# Patient Record
Sex: Male | Born: 1960 | Race: White | Hispanic: No | Marital: Married | Smoking: Never smoker
Health system: Southern US, Community
[De-identification: ages and names within clinical notes are randomized; demographics above are authoritative.]

## PROBLEM LIST (undated history)

## (undated) DIAGNOSIS — E785 Hyperlipidemia, unspecified: Secondary | ICD-10-CM

## (undated) DIAGNOSIS — J45909 Unspecified asthma, uncomplicated: Secondary | ICD-10-CM

## (undated) DIAGNOSIS — I4430 Unspecified atrioventricular block: Secondary | ICD-10-CM

## (undated) DIAGNOSIS — G4730 Sleep apnea, unspecified: Secondary | ICD-10-CM

## (undated) DIAGNOSIS — T7840XA Allergy, unspecified, initial encounter: Secondary | ICD-10-CM

## (undated) DIAGNOSIS — Z9109 Other allergy status, other than to drugs and biological substances: Secondary | ICD-10-CM

## (undated) DIAGNOSIS — K625 Hemorrhage of anus and rectum: Secondary | ICD-10-CM

## (undated) HISTORY — PX: COLONOSCOPY: SHX174

## (undated) HISTORY — DX: Hemorrhage of anus and rectum: K62.5

## (undated) HISTORY — DX: Hyperlipidemia, unspecified: E78.5

## (undated) HISTORY — DX: Allergy, unspecified, initial encounter: T78.40XA

## (undated) HISTORY — DX: Unspecified atrioventricular block: I44.30

## (undated) HISTORY — DX: Unspecified asthma, uncomplicated: J45.909

## (undated) HISTORY — PX: WISDOM TOOTH EXTRACTION: SHX21

## (undated) HISTORY — PX: INGUINAL HERNIA REPAIR: SUR1180

## (undated) HISTORY — PX: POLYPECTOMY: SHX149

## (undated) HISTORY — DX: Other allergy status, other than to drugs and biological substances: Z91.09

## (undated) HISTORY — DX: Sleep apnea, unspecified: G47.30

---

## 2003-07-08 ENCOUNTER — Encounter: Payer: Self-pay | Admitting: Gastroenterology

## 2008-07-05 ENCOUNTER — Encounter: Payer: Self-pay | Admitting: Gastroenterology

## 2008-08-09 ENCOUNTER — Encounter: Payer: Self-pay | Admitting: Gastroenterology

## 2008-08-29 ENCOUNTER — Encounter: Payer: Self-pay | Admitting: Gastroenterology

## 2008-08-29 DIAGNOSIS — K625 Hemorrhage of anus and rectum: Secondary | ICD-10-CM | POA: Insufficient documentation

## 2008-08-29 DIAGNOSIS — E785 Hyperlipidemia, unspecified: Secondary | ICD-10-CM | POA: Insufficient documentation

## 2008-08-29 DIAGNOSIS — D126 Benign neoplasm of colon, unspecified: Secondary | ICD-10-CM | POA: Insufficient documentation

## 2008-09-06 ENCOUNTER — Ambulatory Visit: Payer: Self-pay | Admitting: Gastroenterology

## 2008-10-11 ENCOUNTER — Telehealth: Payer: Self-pay | Admitting: Gastroenterology

## 2008-10-11 ENCOUNTER — Ambulatory Visit: Payer: Self-pay | Admitting: Gastroenterology

## 2008-10-12 ENCOUNTER — Encounter: Payer: Self-pay | Admitting: Gastroenterology

## 2008-10-12 DIAGNOSIS — K649 Unspecified hemorrhoids: Secondary | ICD-10-CM | POA: Insufficient documentation

## 2008-11-09 ENCOUNTER — Ambulatory Visit (HOSPITAL_COMMUNITY): Admission: RE | Admit: 2008-11-09 | Discharge: 2008-11-09 | Payer: Self-pay | Admitting: Gastroenterology

## 2008-11-09 ENCOUNTER — Encounter: Payer: Self-pay | Admitting: Gastroenterology

## 2009-01-13 ENCOUNTER — Ambulatory Visit: Payer: Self-pay | Admitting: Gastroenterology

## 2010-06-03 HISTORY — PX: HERNIA REPAIR: SHX51

## 2010-06-19 ENCOUNTER — Ambulatory Visit (HOSPITAL_COMMUNITY)
Admission: RE | Admit: 2010-06-19 | Discharge: 2010-06-19 | Payer: Self-pay | Source: Home / Self Care | Attending: General Surgery | Admitting: General Surgery

## 2010-06-20 LAB — DIFFERENTIAL
Basophils Absolute: 0 10*3/uL (ref 0.0–0.1)
Basophils Relative: 0 % (ref 0–1)
Eosinophils Absolute: 0.1 10*3/uL (ref 0.0–0.7)
Eosinophils Relative: 2 % (ref 0–5)
Lymphocytes Relative: 33 % (ref 12–46)
Lymphs Abs: 2.7 10*3/uL (ref 0.7–4.0)
Monocytes Absolute: 0.7 10*3/uL (ref 0.1–1.0)
Monocytes Relative: 9 % (ref 3–12)
Neutro Abs: 4.5 10*3/uL (ref 1.7–7.7)
Neutrophils Relative %: 56 % (ref 43–77)

## 2010-06-20 LAB — BASIC METABOLIC PANEL
BUN: 14 mg/dL (ref 6–23)
CO2: 27 mEq/L (ref 19–32)
Calcium: 9.8 mg/dL (ref 8.4–10.5)
Chloride: 105 mEq/L (ref 96–112)
Creatinine, Ser: 1.13 mg/dL (ref 0.4–1.5)
GFR calc Af Amer: >60 mL/min (ref >60)
GFR calc non Af Amer: >60 mL/min (ref >60)
Glucose, Bld: 134 mg/dL — ABNORMAL HIGH (ref 70–99)
Potassium: 4.3 mEq/L (ref 3.5–5.1)
Sodium: 140 mEq/L (ref 135–145)

## 2010-06-20 LAB — SURGICAL PCR SCREEN
MRSA, PCR: NEGATIVE
Staphylococcus aureus: NEGATIVE

## 2010-06-20 LAB — CBC
HCT: 47.7 % (ref 39.0–52.0)
Hemoglobin: 15.8 g/dL (ref 13.0–17.0)
MCH: 28.1 pg (ref 26.0–34.0)
MCHC: 33.1 g/dL (ref 30.0–36.0)
MCV: 84.9 fL (ref 78.0–100.0)
Platelets: 207 10*3/uL (ref 150–400)
RBC: 5.62 MIL/uL (ref 4.22–5.81)
RDW: 13.3 % (ref 11.5–15.5)
WBC: 8 10*3/uL (ref 4.0–10.5)

## 2010-07-06 NOTE — Op Note (Signed)
  NAME:  Paul Rich, Paul Rich             ACCOUNT NO.:  [id number]  MEDICAL RECORD NO.:  [id number]          PATIENT TYPE:  AMB  LOCATION:  SDS                          FACILITY:  [facility]  PHYSICIAN:  Ollen Gross. Vernell Morgans, M.D. DATE OF BIRTH:  Jun 05, 1960  DATE OF PROCEDURE:  06/19/2010 DATE OF DISCHARGE:                              OPERATIVE REPORT   PREOPERATIVE DIAGNOSIS:  Umbilical hernia.  POSTOPERATIVE DIAGNOSIS:  Umbilical hernia.  PROCEDURE:  Umbilical hernia repair with mesh.  SURGEON:  Ollen Gross. Vernell Morgans, M.D.  ANESTHESIA:  General endotracheal.  PROCEDURE IN DETAIL:  After informed consent was obtained, the patient was brought to the operating room, placed in supine position on the operating room table.  After adequate induction of general anesthesia, the patient's abdomen was prepped with ChloraPrep, allowed to dry, and draped in usual sterile manner.  The area around the umbilicus was infiltrated with 0.25% Marcaine.  A small infraumbilical curvilinear incision was made with a 15 blade knife.  This incision was carried down through the skin and subcutaneous tissue sharply with the electrocautery.  The hernia defect was then opened with the electrocautery.  There was just some preperitoneal fat within the hernia.  This was reduced.  The underside of the fascia was cleared by combination of some sharp Bovie dissection and some blunt finger dissection.  We did not enter the abdominal cavity.  We then chose a small piece of the circular umbilical hernia repair with mesh.  This was placed in the preperitoneal space below the fascia.  The fascia was then closed over this with interrupted #1 Novafil stitches.  The wound was irrigated with copious amounts of saline.  The subcutaneous tissue was closed with interrupted 3-0 Vicryl stitches.  Skin was closed with running 4-0 Monocryl subcuticular stitch.  Dermabond dressing was applied.  The patient tolerated the procedure well.  At the  end of the case all needle, sponge, and instrument counts were correct.  The patient was then awakened and taken to recovery in stable condition.     Ollen Gross. Vernell Morgans, M.D.     PST/MEDQ  D:  06/19/2010  T:  06/19/2010  Job:  [id number]  Electronically Signed by Chevis Pretty III M.D. on 07/06/2010 08:53:00 AM

## 2011-03-21 ENCOUNTER — Institutional Professional Consult (permissible substitution): Payer: Self-pay | Admitting: Internal Medicine

## 2011-04-19 ENCOUNTER — Institutional Professional Consult (permissible substitution): Payer: Self-pay | Admitting: Internal Medicine

## 2011-05-21 ENCOUNTER — Ambulatory Visit (INDEPENDENT_AMBULATORY_CARE_PROVIDER_SITE_OTHER): Payer: 59 | Admitting: Internal Medicine

## 2011-05-21 ENCOUNTER — Encounter: Payer: Self-pay | Admitting: Internal Medicine

## 2011-05-21 VITALS — BP 124/70 | HR 64 | Ht 74.0 in | Wt 226.8 lb

## 2011-05-21 DIAGNOSIS — G4733 Obstructive sleep apnea (adult) (pediatric): Secondary | ICD-10-CM | POA: Insufficient documentation

## 2011-05-21 DIAGNOSIS — T7840XA Allergy, unspecified, initial encounter: Secondary | ICD-10-CM

## 2011-05-21 DIAGNOSIS — Z91013 Allergy to seafood: Secondary | ICD-10-CM

## 2011-05-21 DIAGNOSIS — J3089 Other allergic rhinitis: Secondary | ICD-10-CM

## 2011-05-21 NOTE — Progress Notes (Signed)
05/21/11- 71 yoM never smoker referred courtesy of Dr Meredith Staggers with concern of sleep apnea. A previous sleep study 05/13/10 at [facility] gave an AHI of 5.7 hr. Wife Biomedical scientist) is concerned that he snores loudly and stops breathing., He admits snoring wakes himself. He exercises regularly and feels well, but admits with travel he may not sleep as well.  Bed time 03-1029 PM, latency 20-30 minutes, waking twice before finally up at 6AM. He was titrated for CPAP after his original study, but hasn't used it.  No ENT surgery. 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   ROS-see HPI Constitutional:   No-   weight loss, night sweats, fevers, chills, fatigue, lassitude. HEENT:   No-  headaches, difficulty swallowing, tooth/dental problems, sore throat,       Occasional  sneezing, itching, ear ache, nasal congestion, post nasal drip,  CV:  No-   chest pain, orthopnea, PND, swelling in lower extremities, anasarca,                                  dizziness, palpitations Resp: No-   shortness of breath with exertion or at rest.              No-   productive cough,  No non-productive cough,  No- coughing up of blood.              No-   change in color of mucus.  No- wheezing.   Skin: No-   rash or lesions. GI:  No-   heartburn, indigestion, abdominal pain, nausea, vomiting, diarrhea,  GU:  MS:  No-   joint pain or swelling.  No- decreased range of motion.  No- back pain. Neuro-     nothing unusual Psych:  No- change in mood or affect. No depression or anxiety.  No memory loss.  OBJ General- Alert, Oriented, Affect-appropriate, Distress- none acute, muscular Skin- rash-none, lesions- none, excoriation- none Lymphadenopathy- none Head- atraumatic            Eyes- Gross vision intact, PERRLA, conjunctivae clear secretions            Ears- Hearing, canals-normal            Nose- Nasal speech, , +Septal dev to R, mucus, polyps,  erosion, perforation             Throat- Mallampati II-III , mucosa clear , drainage- none, tonsils- moderate Neck- flexible , trachea midline, trace inspiratory stridor , thyroid nl, carotid no bruit Chest - symmetrical excursion , unlabored           Heart/CV- RRR , no murmur , no gallop  , no rub, nl s1 s2                           - JVD- none , edema- none, stasis changes- none, varices- none           Lung- clear to P&A, wheeze- none, cough- none , dullness-none, rub- none           Chest wall-  Abd- tender-no, distended-no, bowel sounds-present, HSM- no Br/ Gen/ Rectal- Not done, not indicated Extrem- cyanosis- none, clubbing, none, atrophy- none, strength- nl Neuro- grossly intact to observation

## 2011-05-21 NOTE — Patient Instructions (Signed)
Order- schedule split protocol NPSG    Dx OSA 

## 2011-05-24 DIAGNOSIS — Z91013 Allergy to seafood: Secondary | ICD-10-CM | POA: Insufficient documentation

## 2011-05-24 DIAGNOSIS — J3089 Other allergic rhinitis: Secondary | ICD-10-CM | POA: Insufficient documentation

## 2011-05-24 NOTE — Assessment & Plan Note (Signed)
Tpo manage medically. Watch for persistence of nasal stuffiness noted today, as it may contribute to snoring.

## 2011-05-24 NOTE — Assessment & Plan Note (Signed)
Discussed pretreatment with an antihistamine before eating unknown foods, but emphasis on avoiding known triggers.

## 2011-05-24 NOTE — Assessment & Plan Note (Signed)
His wife is a reliable witness and concerned about what she notices . We discussed this and will repeat the sleep study. He and I discussed risk factors and medical concerns related to sleep apnea. We compared options of home/unattended study vs center based attended study and he chose the latter.

## 2011-06-09 ENCOUNTER — Ambulatory Visit (HOSPITAL_BASED_OUTPATIENT_CLINIC_OR_DEPARTMENT_OTHER): Payer: 59 | Attending: Internal Medicine

## 2011-06-09 VITALS — Ht 74.0 in | Wt 220.0 lb

## 2011-06-09 DIAGNOSIS — G4733 Obstructive sleep apnea (adult) (pediatric): Secondary | ICD-10-CM | POA: Insufficient documentation

## 2011-06-09 DIAGNOSIS — Z9989 Dependence on other enabling machines and devices: Secondary | ICD-10-CM

## 2011-06-15 DIAGNOSIS — G4733 Obstructive sleep apnea (adult) (pediatric): Secondary | ICD-10-CM

## 2011-06-16 NOTE — Procedures (Signed)
NAME:  Paul Rich, STENCIL NO.:  [id number]  MEDICAL RECORD NO.:  [id number]          PATIENT TYPE:  OUT  LOCATION:  SLEEP CENTER                 FACILITY:  [facility]  PHYSICIAN:  Aireal Slater D. Maple Hudson, MD, FCCP, FACPDATE OF BIRTH:  02/02/1962  DATE OF STUDY:  06/09/2011                           NOCTURNAL POLYSOMNOGRAM  REFERRING PHYSICIAN:  Vidal Lampkins D. Maple Hudson, MD, FCCP, FACP  REFERRING DOCTOR:  Savina Olshefski D. Jareb Radoncic, MD, FCCP, FACP  INDICATION FOR STUDY:  Hypersomnia with sleep apnea.  EPWORTH SLEEPINESS SCORE:  8/24.  BMI 28.2, weight 220 pounds, height 74 inches, neck 17.7 inches.  MEDICATIONS:  Home medications are charted and reviewed.  SLEEP ARCHITECTURE:  Split study protocol.  During the diagnostic phase, total sleep time 126 minutes with sleep efficiency 92.3%.  Stage I was 9.1%, stage II was 64.7%, stage III was absent, REM 26.2% of total sleep time.  Sleep latency 7 minutes, REM latency 81 minutes, awake after sleep onset 3.5 minutes, arousal index 13.8.  Bedtime medication: None.  RESPIRATORY DATA:  Split study protocol.  Apnea-hypopnea index (AHI) 22.4 per hour.  A total of 47 events was scored including 3 obstructive apneas and 44 hypopneas.  Most events were associated with supine sleep position.  REM AHI 3.6 per hour.  CPAP was titrated to 6 CWP, AHI 0.4 per hour.  He wore a medium ResMed Quattro FX full-face mask with C-Flex setting of 1.  OXYGEN DATA:  Moderate snoring before CPAP with oxygen desaturation to a nadir of 86% on room air.  With CPAP titration, mean oxygen saturation held 94.7% on room air and snoring was prevented.  CARDIAC DATA:  Sinus rhythm.  MOVEMENT-PARASOMNIA:  No significant movement disturbance.  No bathroom trips.  IMPRESSIONS-RECOMMENDATIONS: 1. Moderate obstructive sleep apnea/hypopnea syndrome, AHI 22.4 per     hour with mainly supine events, moderate snoring and oxygen     desaturation to a nadir of 86% on room air. 2.  Successful CPAP titration to 6 CWP, AHI 0.4 per hour.  He wore a     medium ResMed Quattro FX full-face mask with heated humidifier and     a C-Flex setting of 1.     Jaaziel Peatross D. Maple Hudson, MD, [facility], FACP Diplomate, Biomedical engineer of Sleep Medicine Electronically Signed    CDY/MEDQ  D:  06/15/2011 10:17:14  T:  06/16/2011 14:16:11  Job:  [id number]

## 2011-06-25 ENCOUNTER — Ambulatory Visit (INDEPENDENT_AMBULATORY_CARE_PROVIDER_SITE_OTHER): Payer: 59 | Admitting: Internal Medicine

## 2011-06-25 ENCOUNTER — Encounter: Payer: Self-pay | Admitting: Internal Medicine

## 2011-06-25 VITALS — BP 110/80 | HR 62 | Ht 74.0 in | Wt 228.8 lb

## 2011-06-25 DIAGNOSIS — G4733 Obstructive sleep apnea (adult) (pediatric): Secondary | ICD-10-CM

## 2011-06-25 DIAGNOSIS — J3089 Other allergic rhinitis: Secondary | ICD-10-CM

## 2011-06-25 NOTE — Progress Notes (Signed)
05/21/11- 26 yoM never smoker referred courtesy of Dr Meredith Staggers with concern of sleep apnea. A previous sleep study 05/13/10 at [facility] gave an AHI of 5.7 hr. Wife Biomedical scientist) is concerned that he snores loudly and stops breathing., He admits snoring wakes himself. He exercises regularly and feels well, but admits with travel he may not sleep as well.  Bed time 03-1029 PM, latency 20-30 minutes, waking twice before finally up at 6AM. He was titrated for CPAP after his original study, but hasn't used it.  No ENT surgery. 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   06/25/11- 50 yoM never smoker followed for OSA He returned after sleep study and we went over the graphic results, discussed implications and treatment options for his moderate obstructive sleep apnea. NPSG- 06/09/11- AHI 22.4/hr    ROS-see HPI Constitutional:   No-   weight loss, night sweats, fevers, chills, fatigue, lassitude. Exercising regularly HEENT:   No-  headaches, difficulty swallowing, tooth/dental problems, sore throat,       Occasional  sneezing, itching, ear ache, nasal congestion, post nasal drip,  CV:  No-   chest pain, orthopnea, PND, swelling in lower extremities, anasarca,  dizziness, palpitations Resp: No-   shortness of breath with exertion or at rest.              No-   productive cough,  No non-productive cough,  No- coughing up of blood.              No-   change in color of mucus.  No- wheezing.   Skin: No-   rash or lesions. GI:  No-   heartburn, indigestion, abdominal pain, nausea, vomiting, diarrhea,  GU:  MS:  No-   joint pain or swelling.  No- decreased range of motion.  No- back pain. Neuro-     nothing unusual Psych:  No- change in mood or affect. No depression or anxiety.  No memory loss.  OBJ General- Alert, Oriented, Affect-appropriate, Distress- none acute, muscular Skin- rash-none, lesions- none, excoriation-  none Lymphadenopathy- none Head- atraumatic            Eyes- Gross vision intact, PERRLA, conjunctivae clear secretions            Ears- Hearing, canals-normal            Nose- Nasal speech, , +Septal dev to R, mucus, polyps, erosion, perforation             Throat- Mallampati II-III , mucosa clear , drainage- none, tonsils- moderate Neck- flexible , trachea midline, trace inspiratory stridor , thyroid nl, carotid no bruit Chest - symmetrical excursion , unlabored           Heart/CV- RRR , no murmur , no gallop  , no rub, nl s1 s2                           - JVD- none , edema- none, stasis changes- none, varices- none           Lung- clear to P&A, wheeze- none, cough- none , dullness-none, rub- none           Chest wall-  Abd- Br/ Gen/ Rectal- Not done, not indicated Extrem- cyanosis- none, clubbing, none, atrophy- none, strength- nl Neuro- grossly intact to observation

## 2011-06-25 NOTE — Patient Instructions (Signed)
Order-  DME- new CPAP 6 cwp, humidifier, mask of choice  Dx   OSA  Please call as needed

## 2011-06-28 NOTE — Assessment & Plan Note (Signed)
He has some septal deviation. This is not at time of year with significant inhaled allergen exposure. We will watch for interaction with his CPAP therapy as the season changes.

## 2011-06-28 NOTE — Assessment & Plan Note (Signed)
We had a discussion of choices and expectations. CPAP had been successfully titrated to 6 CWP/AHI 0.4 per hour. We will start with home CPAP at 6.

## 2011-08-06 ENCOUNTER — Ambulatory Visit (INDEPENDENT_AMBULATORY_CARE_PROVIDER_SITE_OTHER): Payer: 59 | Admitting: Internal Medicine

## 2011-08-06 ENCOUNTER — Encounter: Payer: Self-pay | Admitting: Internal Medicine

## 2011-08-06 VITALS — BP 116/72 | HR 73 | Ht 74.0 in | Wt 230.0 lb

## 2011-08-06 DIAGNOSIS — G4733 Obstructive sleep apnea (adult) (pediatric): Secondary | ICD-10-CM

## 2011-08-06 NOTE — Patient Instructions (Signed)
Continue trying with CPAP for now.  As an alternative- oral mandibular advancement devices would be a good "Plan B" Orthodontists- Dr Primitivo Gauze- [facility]                         Dr Althea Grimmer   [facility]

## 2011-08-06 NOTE — Progress Notes (Signed)
05/21/11- 41 yoM never smoker referred courtesy of Dr Meredith Staggers with concern of sleep apnea. A previous sleep study 05/13/10 at [facility] gave an AHI of 5.7 hr. Wife Biomedical scientist) is concerned that he snores loudly and stops breathing., He admits snoring wakes himself. He exercises regularly and feels well, but admits with travel he may not sleep as well.  Bed time 03-1029 PM, latency 20-30 minutes, waking twice before finally up at 6AM. He was titrated for CPAP after his original study, but hasn't used it.  No ENT surgery. 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   06/25/11- 50 yoM never smoker followed for OSA He returned after sleep study and we went over the graphic results, discussed implications and treatment options for his moderate obstructive sleep apnea. NPSG- 06/09/11- AHI 22.4/hr   08/06/11- 50 yoM never smoker followed for OSA, allergic rhinitis, Seafood anaphyllaxis/ Epipen He is trying to get used to wearing CPAP. Still pulls off every night after 3 or 4 hours because "it bothers me". He isn't able to articulate what that means but it doesn't hurt and the pressure does not seem too high.  ROS-see HPI Constitutional:   No-   weight loss, night sweats, fevers, chills, fatigue, lassitude. Exercising regularly HEENT:   No-  headaches, difficulty swallowing, tooth/dental problems, sore throat,       Occasional  sneezing, itching, ear ache, nasal congestion, post nasal drip,  CV:  No-   chest pain, orthopnea, PND, swelling in lower extremities, anasarca,  dizziness, palpitations Resp: No-   shortness of breath with exertion or at rest.              No-   productive cough,  No non-productive cough,  No- coughing up of blood.              No-   change in color of mucus.  No- wheezing.   Skin: No-   rash or lesions. GI:  No-   heartburn, indigestion,  GU:  MS:  No-   joint pain or swelling.  Neuro-     nothing  unusual Psych:  No- change in mood or affect. No depression or anxiety.  No memory loss.  OBJ General- Alert, Oriented, Affect-appropriate, Distress- none acute, muscular Skin- rash-none, lesions- none, excoriation- none Lymphadenopathy- none Head- atraumatic            Eyes- Gross vision intact, PERRLA, conjunctivae clear secretions            Ears- Hearing, canals-normal            Nose- Nasal speech, , +Septal dev to R, mucus, polyps, erosion, perforation             Throat- Mallampati II-III , mucosa clear , drainage- none, tonsils- moderate Neck- flexible , trachea midline, trace inspiratory stridor , thyroid nl, carotid no bruit Chest - symmetrical excursion , unlabored           Heart/CV- RRR , no murmur , no gallop  , no rub, nl s1 s2                           - JVD- none , edema- none, stasis changes- none, varices- none           Lung- clear to P&A, wheeze- none, cough- none , dullness-none, rub- none  Chest wall-  Abd- Br/ Gen/ Rectal- Not done, not indicated Extrem- cyanosis- none, clubbing, none, atrophy- none, strength- nl Neuro- grossly intact to observation

## 2011-08-10 NOTE — Assessment & Plan Note (Signed)
CPAP 6. Discussed comfort, compliance and alternatives. He is interested in oral appliances but will continue trying with CPAP.

## 2011-08-15 ENCOUNTER — Encounter: Payer: Self-pay | Admitting: Internal Medicine

## 2011-10-12 ENCOUNTER — Other Ambulatory Visit: Payer: Self-pay | Admitting: Family Medicine

## 2011-12-09 ENCOUNTER — Ambulatory Visit (INDEPENDENT_AMBULATORY_CARE_PROVIDER_SITE_OTHER): Payer: 59 | Admitting: Internal Medicine

## 2011-12-09 ENCOUNTER — Encounter: Payer: Self-pay | Admitting: Internal Medicine

## 2011-12-09 VITALS — BP 112/68 | HR 65 | Ht 74.0 in | Wt 215.4 lb

## 2011-12-09 DIAGNOSIS — G4733 Obstructive sleep apnea (adult) (pediatric): Secondary | ICD-10-CM

## 2011-12-09 NOTE — Patient Instructions (Addendum)
Order- refer to orthodontist Dr Althea Grimmer   For oral appliance for sleep apnea

## 2011-12-09 NOTE — Progress Notes (Signed)
05/21/11- 60 yoM never smoker referred courtesy of Dr Meredith Staggers with concern of sleep apnea. A previous sleep study 05/13/10 at [facility] gave an AHI of 5.7 hr. Wife Biomedical scientist) is concerned that he snores loudly and stops breathing., He admits snoring wakes himself. He exercises regularly and feels well, but admits with travel he may not sleep as well.  Bed time 03-1029 PM, latency 20-30 minutes, waking twice before finally up at 6AM. He was titrated for CPAP after his original study, but hasn't used it.  No ENT surgery. 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   06/25/11- 50 yoM never smoker followed for OSA He returned after sleep study and we went over the graphic results, discussed implications and treatment options for his moderate obstructive sleep apnea. NPSG- 06/09/11- AHI 22.4/hr   08/06/11- 50 yoM never smoker followed for OSA, allergic rhinitis, Seafood anaphyllaxis/ Epipen He is trying to get used to wearing CPAP. Still pulls off every night after 3 or 4 hours because "it bothers me". He isn't able to articulate what that means but it doesn't hurt and the pressure does not seem too high.  12/09/11-  50 yoM never smoker followed for OSA, allergic rhinitis, Seafood anaphyllaxis/ Epipen  Pt states he is still waking mid night and pulls mask off. Currently only wearing mask 3-4 hrs nightly. Pt states the "seal" is good when first falling asleep but from tossing and turning the seal breaks and wakes him up.  Pt is down 15lbs since March, says that wife told him he is not snoring as much. He has always been an active sleeper, tossing and turning but without parasomnia. This pulls on his mask and hose which he finds disturbing. His physician has been dieting and in addition he has been exercising vigorously to result in a 15 pound weight loss. We discussed weight and sleep apnea. We discussed alternatives to CPAP,  especially oral appliances. He is interested now  ROS-see HPI Constitutional:   No-   weight loss, night sweats, fevers, chills, fatigue, lassitude. Exercising regularly HEENT:   No-  headaches, difficulty swallowing, tooth/dental problems, sore throat,       Occasional  sneezing, itching, ear ache, nasal congestion, post nasal drip,  CV:  No-   chest pain, orthopnea, PND, swelling in lower extremities, anasarca,  dizziness, palpitations Resp: No-   shortness of breath with exertion or at rest.              No-   productive cough,  No non-productive cough,  No- coughing up of blood.     Psych:  No- change in mood or affect. No depression or anxiety.  No memory loss.  OBJ General- Alert, Oriented, Affect-appropriate, Distress- none acute, muscular Skin- rash-none, lesions- none, excoriation- none Lymphadenopathy- none Head- atraumatic            Eyes- Gross vision intact, PERRLA, conjunctivae clear secretions            Ears- Hearing, canals-normal            Nose-  , +Septal dev to R, mucus, polyps, erosion, perforation             Throat- Mallampati II-III , mucosa clear , drainage- none, tonsils- moderate Neck- flexible , trachea midline, trace inspiratory stridor , thyroid nl, carotid no bruit Chest - symmetrical excursion , unlabored  Heart/CV- RRR , no murmur , no gallop  , no rub, nl s1 s2                           - JVD- none , edema- none, stasis changes- none, varices- none           Lung- clear to P&A, wheeze- none, cough- none , dullness-none, rub- none

## 2011-12-10 NOTE — Assessment & Plan Note (Signed)
He is physically comfortable in bed. We do not think it is appropriate to try to sedate him so that he will sleep more quietly. Pulling on the mask and hose is disturbing his sleep. I discussed oral appliances as alternative. Plan-Referral for consideration of oral appliance to treat OSA.

## 2011-12-19 ENCOUNTER — Other Ambulatory Visit: Payer: Self-pay | Admitting: Physician Assistant

## 2012-03-10 ENCOUNTER — Telehealth: Payer: Self-pay

## 2012-03-10 NOTE — Telephone Encounter (Signed)
Dr Neva Seat, do you want to put orders in for pt to have labs drawn before his appt w/you?

## 2012-03-10 NOTE — Telephone Encounter (Signed)
This patient is scheduled to see dr Neva Seat on 03/23/12 @ 2:00 by appt, however, he states there should be a standing order in his chart for him to come and have bloodwork done prior to the appt so dr green can have results at the time of his visit.  i'm not seeing orders for bloodwork? Please call pt on her work phone to advise [phone]

## 2012-03-11 ENCOUNTER — Other Ambulatory Visit: Payer: Self-pay | Admitting: Family Medicine

## 2012-03-11 DIAGNOSIS — E781 Pure hyperglyceridemia: Secondary | ICD-10-CM

## 2012-03-11 DIAGNOSIS — N529 Male erectile dysfunction, unspecified: Secondary | ICD-10-CM

## 2012-03-11 NOTE — Telephone Encounter (Signed)
Chart in your box [phone]

## 2012-03-11 NOTE — Telephone Encounter (Signed)
Labs entered based on 02/01/11 ov.

## 2012-03-11 NOTE — Telephone Encounter (Signed)
Please pull paper chart as not seen in Epic recently.

## 2012-03-12 NOTE — Telephone Encounter (Signed)
Notified pt that labs have been ordered and reminded him to fast for lipid lab.

## 2012-03-17 ENCOUNTER — Other Ambulatory Visit (INDEPENDENT_AMBULATORY_CARE_PROVIDER_SITE_OTHER): Payer: 59

## 2012-03-17 ENCOUNTER — Other Ambulatory Visit: Payer: Self-pay | Admitting: Physician Assistant

## 2012-03-17 DIAGNOSIS — N529 Male erectile dysfunction, unspecified: Secondary | ICD-10-CM

## 2012-03-17 DIAGNOSIS — E781 Pure hyperglyceridemia: Secondary | ICD-10-CM

## 2012-03-17 LAB — COMPREHENSIVE METABOLIC PANEL
ALT: 37 U/L (ref 0–53)
AST: 30 U/L (ref 0–37)
Albumin: 4.3 g/dL (ref 3.5–5.2)
Alkaline Phosphatase: 47 U/L (ref 39–117)
BUN: 14 mg/dL (ref 6–23)
CO2: 26 mEq/L (ref 19–32)
Calcium: 9.2 mg/dL (ref 8.4–10.5)
Chloride: 101 mEq/L (ref 96–112)
Creat: 1 mg/dL (ref 0.50–1.35)
Glucose, Bld: 88 mg/dL (ref 70–99)
Potassium: 4.2 mEq/L (ref 3.5–5.3)
Sodium: 136 mEq/L (ref 135–145)
Total Bilirubin: 1.3 mg/dL — ABNORMAL HIGH (ref 0.3–1.2)
Total Protein: 6.9 g/dL (ref 6.0–8.3)

## 2012-03-17 LAB — LIPID PANEL
Cholesterol: 164 mg/dL (ref 0–200)
HDL: 34 mg/dL — ABNORMAL LOW (ref >39)
LDL Cholesterol: 90 mg/dL (ref 0–99)
Total CHOL/HDL Ratio: 4.8 Ratio
Triglycerides: 198 mg/dL — ABNORMAL HIGH (ref <150)
VLDL: 40 mg/dL (ref 0–40)

## 2012-03-17 LAB — TESTOSTERONE: Testosterone: 787.98 ng/dL (ref 300–890)

## 2012-03-17 NOTE — Progress Notes (Signed)
Patient here for labs only. 

## 2012-03-23 ENCOUNTER — Ambulatory Visit (INDEPENDENT_AMBULATORY_CARE_PROVIDER_SITE_OTHER): Payer: 59 | Admitting: Family Medicine

## 2012-03-23 ENCOUNTER — Encounter: Payer: Self-pay | Admitting: Family Medicine

## 2012-03-23 VITALS — BP 120/84 | HR 72 | Temp 97.7°F | Resp 16 | Ht 74.0 in | Wt 213.0 lb

## 2012-03-23 DIAGNOSIS — Z0000 Encounter for general adult medical examination without abnormal findings: Secondary | ICD-10-CM

## 2012-03-23 DIAGNOSIS — E785 Hyperlipidemia, unspecified: Secondary | ICD-10-CM

## 2012-03-23 DIAGNOSIS — N529 Male erectile dysfunction, unspecified: Secondary | ICD-10-CM

## 2012-03-23 DIAGNOSIS — R7989 Other specified abnormal findings of blood chemistry: Secondary | ICD-10-CM

## 2012-03-23 DIAGNOSIS — Z23 Encounter for immunization: Secondary | ICD-10-CM

## 2012-03-23 MED ORDER — OMEGA-3-ACID ETHYL ESTERS 1 G PO CAPS: 2.0000 g | ORAL_CAPSULE | Freq: Two times a day (BID) | ORAL | Status: DC

## 2012-03-23 MED ORDER — SILDENAFIL CITRATE 100 MG PO TABS: 100.0000 mg | ORAL_TABLET | ORAL | Status: DC | PRN

## 2012-03-23 MED ORDER — PRAVASTATIN SODIUM 20 MG PO TABS: 20.0000 mg | ORAL_TABLET | Freq: Every day | ORAL | Status: DC

## 2012-03-23 NOTE — Progress Notes (Signed)
Subjective:    Patient ID: Paul Rich, male    DOB: [birth year], 51 y.o.   MRN: [id number]  HPI Paul Rich is Paul 51 y.o. male  Hx of mixed hyperlipidemia - labs drawn 03/17/12:  Results for orders placed in visit on 03/17/12  COMPREHENSIVE METABOLIC PANEL      Component Value Range   Sodium 136  135 - 145 mEq/L   Potassium 4.2  3.5 - 5.3 mEq/L   Chloride 101  96 - 112 mEq/L   CO2 26  19 - 32 mEq/L   Glucose, Bld 88  70 - 99 mg/dL   BUN 14  6 - 23 mg/dL   Creat 9.56  2.13 - 0.86 mg/dL   Total Bilirubin 1.3 (*) 0.3 - 1.2 mg/dL   Alkaline Phosphatase 47  39 - 117 U/L   AST 30  0 - 37 U/L   ALT 37  0 - 53 U/L   Total Protein 6.9  6.0 - 8.3 g/dL   Albumin 4.3  3.5 - 5.2 g/dL   Calcium 9.2  8.4 - 57.8 mg/dL  TESTOSTERONE      Component Value Range   Testosterone 787.98  300 - 890 ng/dL  LIPID PANEL      Component Value Range   Cholesterol 164  0 - 200 mg/dL   Triglycerides 469 (*) <150 mg/dL   HDL 34 (*) >62 mg/dL   Total CHOL/HDL Ratio 4.8     VLDL 40  0 - 40 mg/dL   LDL Cholesterol 90  0 - 99 mg/dL   Started trying [facility].  Had bloodwork and exam in April.  Now on low glycemic index diet, various supplements,  testosterone injections 60mg   twice per week for past 1 week, prior on 50mg  twice per week., and hcg 500IU's SQ twice per week before testosterone injection to prevent own testosterone from shutting down. 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 DHEA 50mg  each morning. High intensity cardio intervals twice per week. Has lost 15-18 pounds. No testicular masses or lumps. Still some decreased libido. Their target range for testosterone is 1150 to 1250. Testosterone level 658 on 02/07/12.  Taking Viagra 100mg   - usually 1/2 pill, sometimes full pill.  Wife with MS for 20 years. This has affected sex life. Occasional blood shot eyes.   Off tricor for 5 months.  Still taking pravachol, and lovaza, and low dose aspirin.   Most recent  labs 02/07/12: HGB - 17.8, RBC 6.7 (up from 16.6 and 5.9 on 11/01/11).  See outside labs sheet - PSA   Lipid panel.   Review of Systems No abd pain, or new side effects of meds - other as above.     Objective:   Physical Exam  Vitals reviewed. Constitutional: He is oriented to person, place, and time. He appears well-developed and well-nourished.  HENT:  Head: Normocephalic and atraumatic.  Eyes: EOM are normal. Pupils are equal, round, and reactive to light.  Neck: No JVD present. Carotid bruit is not present.  Cardiovascular: Normal rate, regular rhythm and normal heart sounds.   No murmur heard. Pulmonary/Chest: Effort normal and breath sounds normal. He has no rales.  Musculoskeletal: He exhibits no edema.  Neurological: He is alert and oriented to person, place, and time.  Skin: Skin is warm and dry.  Psychiatric: He has Paul normal mood and affect. His behavior is normal.          Assessment &  Plan:  Paul Rich is Paul 51 y.o. male 1. Hyperlipidemia  omega-3 acid ethyl esters (LOVAZA) 1 G capsule, pravastatin (PRAVACHOL) 20 MG tablet, Comprehensive metabolic panel, Lipid panel  2. Low testosterone  sildenafil (VIAGRA) 100 MG tablet, CBC, Testosterone, PSA  3. ED (erectile dysfunction)  sildenafil (VIAGRA) 100 MG tablet, TSH, CBC, Testosterone, PSA  4. Need for Tdap vaccination  Tdap vaccine greater than or equal to 7yo IM  5. Annual physical exam  Comprehensive metabolic panel, Lipid panel, TSH, CBC, PSA   Future orders for physical in next 6 months.  Hx of lowTestosterone - Now followed by [facility] practice.  Cautioned about dosing of testosterone to the levels they recommended, and discussed elevation of HGb/RBC.  This may need to be discussed with endocrinology.  Hyperlipidemia - outside numbers better than ones here recently, but ok with avoiding tricor, ads diet changes and weight loss.  ED - persistent- continue Viagra.  tdap updated.

## 2012-03-23 NOTE — Patient Instructions (Signed)
Make sure your other provider keep an eye on your hemoglobin and prostate tests with testosterone replacement.  Plan on physical in next 6 months. I will put an order in for your cholesterol tests, kidney tests, blood counts and prostate tests for that office visit.

## 2012-06-11 ENCOUNTER — Ambulatory Visit: Payer: 59 | Admitting: Internal Medicine

## 2012-06-15 ENCOUNTER — Other Ambulatory Visit: Payer: Self-pay | Admitting: Physician Assistant

## 2012-10-27 ENCOUNTER — Ambulatory Visit: Payer: BC Managed Care – PPO

## 2012-11-16 ENCOUNTER — Ambulatory Visit: Payer: BC Managed Care – PPO | Admitting: Internal Medicine

## 2012-11-16 VITALS — BP 143/82 | HR 71 | Temp 97.7°F | Resp 18 | Ht 74.0 in | Wt 212.0 lb

## 2012-11-16 DIAGNOSIS — R21 Rash and other nonspecific skin eruption: Secondary | ICD-10-CM

## 2012-11-16 DIAGNOSIS — B356 Tinea cruris: Secondary | ICD-10-CM

## 2012-11-16 LAB — POCT SKIN KOH: Skin KOH, POC: POSITIVE

## 2012-11-16 MED ORDER — FLUCONAZOLE 150 MG PO TABS: ORAL_TABLET | ORAL | Status: DC

## 2012-11-16 MED ORDER — KETOCONAZOLE 2 % EX CREA: TOPICAL_CREAM | Freq: Every day | CUTANEOUS | Status: DC

## 2012-11-16 NOTE — Progress Notes (Signed)
  Subjective:    Patient ID: Paul Rich, male    DOB: 1961/05/22, 52 y.o.   MRN: [id number]  Patient presents today with complaints of a rash on his right inner thigh area and also on his right testicle. He states that he first noticed the rash a few weeks ago. He states that the area does not itch nor does it have drainage. He mentions that he was treated for scabies on his abdominal area a few weeks ago as well. He states that he thinks this rash appears somewhat different than the scabies rash. He states that he has fears that the rash may be caused by a STI from relations six months ago. During the visit he mentions the use of a spray for jock itch and that his wife has recurrent yeast infections.   Rash This is a new problem. The current episode started more than 1 month ago. The problem has been gradually worsening since onset. The affected locations include the groin. The rash is characterized by redness (multiple lesions). He was exposed to nothing. Pertinent negatives include no cough, diarrhea, fever, shortness of breath, sore throat or vomiting. Treatments tried: mentions that he has used a jock itch spray. The treatment provided no relief. There is no history of allergies or eczema.    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  HENT: Negative.  Negative for sore throat.   Eyes: Negative.   Respiratory: Negative for cough and shortness of breath.   Cardiovascular: Negative for chest pain and leg swelling.  Gastrointestinal: Negative for nausea, vomiting and diarrhea.  Endocrine: Negative.   Musculoskeletal: Negative.   Skin: Positive for rash.       In the right groin area.  Allergic/Immunologic: Negative for environmental allergies. Food allergies: shellfish.  Neurological: Negative for weakness.  Psychiatric/Behavioral: Negative.        Objective:   Physical Exam  Vitals reviewed. Constitutional: He is  oriented to person, place, and time. He appears well-developed and well-nourished.  HENT:  Head: Normocephalic and atraumatic.  Eyes: Conjunctivae are normal.  Neck: Normal range of motion.  Cardiovascular: Normal rate and regular rhythm.   Pulmonary/Chest: Effort normal. No respiratory distress. He has no wheezes.  Genitourinary: Penis normal. No penile tenderness.  Musculoskeletal: Normal range of motion. He exhibits no edema.  Neurological: He is alert and oriented to person, place, and time.  Skin: Skin is warm and dry. Lesion and rash noted. Rash is papular. There is erythema.     To right groin area.   Psychiatric: He has a normal mood and affect. His behavior is normal.   The lesions are like satellite lesions on the right anterior medial thigh extending into the groin with erythema of the scrotal sac on that side/there are some similar lesions extending from the pubic male in to the umbilicus  KOH clearly positive     Assessment & Plan:   Assessment- Rash of groin (yeast)  Plan- Ketoconazole 2% cream topically QD for 1 to 2 months  -Diflucan 150 now and repeat in one week         - Keep air clean and dry         -Follow up with worsening s/s

## 2012-11-30 ENCOUNTER — Ambulatory Visit: Payer: BC Managed Care – PPO | Admitting: Family Medicine

## 2012-12-07 ENCOUNTER — Ambulatory Visit (INDEPENDENT_AMBULATORY_CARE_PROVIDER_SITE_OTHER): Payer: BC Managed Care – PPO | Admitting: Family Medicine

## 2012-12-07 ENCOUNTER — Encounter: Payer: Self-pay | Admitting: Family Medicine

## 2012-12-07 VITALS — BP 120/80 | HR 58 | Temp 97.8°F | Resp 16 | Ht 73.8 in | Wt 208.0 lb

## 2012-12-07 DIAGNOSIS — E291 Testicular hypofunction: Secondary | ICD-10-CM

## 2012-12-07 DIAGNOSIS — E785 Hyperlipidemia, unspecified: Secondary | ICD-10-CM

## 2012-12-07 DIAGNOSIS — N529 Male erectile dysfunction, unspecified: Secondary | ICD-10-CM

## 2012-12-07 MED ORDER — PRAVASTATIN SODIUM 20 MG PO TABS: 20.0000 mg | ORAL_TABLET | Freq: Every day | ORAL | Status: DC

## 2012-12-07 MED ORDER — OMEGA-3-ACID ETHYL ESTERS 1 G PO CAPS: 2.0000 g | ORAL_CAPSULE | Freq: Two times a day (BID) | ORAL | Status: DC

## 2012-12-07 MED ORDER — SILDENAFIL CITRATE 100 MG PO TABS: 50.0000 mg | ORAL_TABLET | Freq: Every day | ORAL | Status: DC | PRN

## 2012-12-07 NOTE — Progress Notes (Signed)
  Subjective:    Patient ID: Paul Rich, male    DOB: 05/28/1961, 52 y.o.   MRN: [id number]  HPI Paul Rich is a 52 y.o. male  Hyperlipidemia - not fasting today. - had cholesterol at other lab about 2 months ago. Cholesterol in October 2013 - TC 164, trig 198, HDL 34, LDL 90.   Has had [facility] physical in April - stress test there ok. Multiple labs done there.   ED - uses Viagra twice per month. Last testosterone level 787. Using testosterone injections and HCG from [facility]. Also on MVI, reservetrol for sleep aid.   Review of Systems  Constitutional: Negative for fatigue and unexpected weight change.  Respiratory: Negative for cough, chest tightness and shortness of breath.   Cardiovascular: Negative for chest pain.  Gastrointestinal: Negative for abdominal pain and blood in stool.  Musculoskeletal: Negative for myalgias.  Neurological: Negative for dizziness, light-headedness and headaches.       Objective:   Physical Exam  Vitals reviewed. Constitutional: He is oriented to person, place, and time. He appears well-developed and well-nourished.  HENT:  Head: Normocephalic and atraumatic.  Eyes: EOM are normal. Pupils are equal, round, and reactive to light.  Neck: No JVD present. Carotid bruit is not present.  Cardiovascular: Normal rate, regular rhythm and normal heart sounds.   No murmur heard. Pulmonary/Chest: Effort normal and breath sounds normal. He has no rales.  Musculoskeletal: He exhibits no edema.  Neurological: He is alert and oriented to person, place, and time.  Skin: Skin is warm and dry.  Psychiatric: He has a normal mood and affect.       Assessment & Plan:  Paul Rich is a 52 y.o. male Hyperlipidemia - Plan: omega-3 acid ethyl esters (LOVAZA) 1 G capsule, pravastatin (PRAVACHOL) 20 MG tablet.  Refilled lovaza and pravachol, he will bring copy of recent outside lab results, so no labs drawn in office.   ED (erectile dysfunction) -  prior testosterone levels normal, and has been receiving supplements through [facility]. Will bring labs for review. Refilled Viagra - 50-100mg  prn - SED.   Meds ordered this encounter  Medications  . omega-3 acid ethyl esters (LOVAZA) 1 G capsule    Sig: Take 2 capsules (2 g total) by mouth 2 (two) times daily.    Dispense:  270 capsule    Refill:  1  . pravastatin (PRAVACHOL) 20 MG tablet    Sig: Take 1 tablet (20 mg total) by mouth daily.    Dispense:  90 tablet    Refill:  1  . sildenafil (VIAGRA) 100 MG tablet    Sig: Take 0.5-1 tablets (50-100 mg total) by mouth daily as needed for erectile dysfunction.    Dispense:  5 tablet    Refill:  6   Patient Instructions  Bring the outside labs for me to review, then we can determine if other labs needed, and when.  Plan on follow up for physical in next 6 months.

## 2012-12-07 NOTE — Patient Instructions (Addendum)
Bring the outside labs for me to review, then we can determine if other labs needed, and when.  Plan on follow up for physical in next 6 months.

## 2012-12-29 NOTE — Progress Notes (Signed)
Addendum 12/29/12:  Outside labs brought to office from 09/25/12: HGB 18.9, creat 1.16, bili 1.4, glucose 85, A1c 5.4, tchol 164, trig 235, HDL 33, LDL 84 (trig up from 163, and HDL down from 35 in 07/13/12). psa 0.8, tsh.  Also had Vo2 max testing - see lab reports.

## 2013-01-18 ENCOUNTER — Telehealth: Payer: Self-pay | Admitting: Radiology

## 2013-01-18 DIAGNOSIS — E785 Hyperlipidemia, unspecified: Secondary | ICD-10-CM

## 2013-01-18 MED ORDER — OMEGA-3-ACID ETHYL ESTERS 1 G PO CAPS: 2.0000 g | ORAL_CAPSULE | Freq: Two times a day (BID) | ORAL | Status: DC

## 2013-01-18 MED ORDER — PRAVASTATIN SODIUM 20 MG PO TABS: 20.0000 mg | ORAL_TABLET | Freq: Every day | ORAL | Status: DC

## 2013-01-18 MED ORDER — SILDENAFIL CITRATE 100 MG PO TABS: 50.0000 mg | ORAL_TABLET | Freq: Every day | ORAL | Status: DC | PRN

## 2013-01-18 NOTE — Addendum Note (Signed)
Addended by: Marinus Maw on: 01/18/2013 05:02 PM   Modules accepted: Orders

## 2013-01-18 NOTE — Telephone Encounter (Signed)
The following email was submitted to your website from [facility] Checking on a prescription refill order. I am one of Dr. Paralee Cancel patients, saw him 3 weeks ago and he told me he was sending in my Rx refills to [facility] for 3 medications: Lovaza, Viagra, and Pravastatin (mail order pharmacy order, in which 69-month refills are sent each time).  I have been waiting for the medications to arrive, and they have not -- and I'm now out of all of them. Can someone check to make sure the [facility] refill orders were sent through?  Thank you.  Date of birth: [birth year]

## 2013-01-18 NOTE — Telephone Encounter (Signed)
Rx's were sent to [facility], I changed over to [facility] and notified the pt of the change. He understood and will be awaiting the medication  FYI to you Dr Neva Seat

## 2013-01-18 NOTE — Addendum Note (Signed)
Addended by: Marinus Maw on: 01/18/2013 05:06 PM   Modules accepted: Medications

## 2013-02-12 ENCOUNTER — Encounter: Payer: Self-pay | Admitting: Radiology

## 2013-04-08 ENCOUNTER — Other Ambulatory Visit: Payer: Self-pay

## 2013-06-01 ENCOUNTER — Other Ambulatory Visit: Payer: Self-pay | Admitting: Family Medicine

## 2013-06-01 ENCOUNTER — Ambulatory Visit: Payer: BC Managed Care – PPO | Admitting: Physician Assistant

## 2013-06-01 VITALS — BP 124/72 | HR 63 | Temp 97.8°F | Resp 18 | Ht 73.5 in | Wt 211.0 lb

## 2013-06-01 DIAGNOSIS — J019 Acute sinusitis, unspecified: Secondary | ICD-10-CM

## 2013-06-01 DIAGNOSIS — R059 Cough, unspecified: Secondary | ICD-10-CM

## 2013-06-01 DIAGNOSIS — R05 Cough: Secondary | ICD-10-CM

## 2013-06-01 DIAGNOSIS — J029 Acute pharyngitis, unspecified: Secondary | ICD-10-CM

## 2013-06-01 DIAGNOSIS — N529 Male erectile dysfunction, unspecified: Secondary | ICD-10-CM

## 2013-06-01 LAB — POCT RAPID STREP A (OFFICE): Rapid Strep A Screen: NEGATIVE

## 2013-06-01 MED ORDER — IPRATROPIUM BROMIDE 0.06 % NA SOLN: 2.0000 | Freq: Three times a day (TID) | NASAL | Status: DC

## 2013-06-01 MED ORDER — AMOXICILLIN-POT CLAVULANATE 875-125 MG PO TABS: 1.0000 | ORAL_TABLET | Freq: Two times a day (BID) | ORAL | Status: DC

## 2013-06-01 MED ORDER — HYDROCODONE-HOMATROPINE 5-1.5 MG/5ML PO SYRP: ORAL_SOLUTION | ORAL | Status: DC

## 2013-06-01 NOTE — Progress Notes (Signed)
Subjective:    Patient ID: Paul Rich, male    DOB: March 29, 1961, 52 y.o.   MRN: [id number]  HPI 52 year old male presents with a 12 day history of nasal congestion, rhinorrhea, post nasal drip, sinus pressure, and cough. Subjective fever and chills. Sinus pressure is the greatest along the maxillary sinuses. Cough is secondary to post nasal drip. He does not feel like there is any chest congestion. No SOB or wheezing. Ear feel full causing some muffled hearing. Some nausea and diarrhea. No vomiting. Some sick contacts at work. Generally healthy.   PMH: Past Medical History  Diagnosis Date  . Other and unspecified hyperlipidemia   . Hemorrhage of rectum and anus   . AV block   . Childhood asthma   . Environmental allergies   . Allergy    Allergies  Allergen Reactions  . Shellfish Allergy Anaphylaxis and Swelling    Of swelling of eyes   History   Social History  . Marital Status: Married    Spouse Name: N/A    Number of Children: N/A  . Years of Education: N/A   Occupational History  . HR exec-business    Social History Main Topics  . Smoking status: Never Smoker   . Smokeless tobacco: Not on file  . Alcohol Use: Yes     Comment: 3-4 a week   . Drug Use: No  . Sexual Activity: Yes   Other Topics Concern  . Not on file   Social History Narrative  . No narrative on file   Family History  Problem Relation Age of Onset  . Diabetes Mother   . Heart disease Mother   . Kidney disease Paternal Grandmother   . Lung cancer Paternal Grandmother   . Lung cancer Paternal Grandfather     Review of Systems  Constitutional: Positive for fever, chills, appetite change and fatigue.  HENT: Positive for congestion, ear pain, hearing loss, postnasal drip, rhinorrhea and sore throat. Negative for ear discharge and voice change.        Muffled hearing  Respiratory: Positive for cough. Negative for shortness of breath and wheezing.   Gastrointestinal: Positive for nausea and  diarrhea. Negative for vomiting.  Neurological: Positive for headaches.       Objective:   Physical Exam  Physical Exam: Blood pressure 124/72, pulse 63, temperature 97.8 F (36.6 C), temperature source Oral, resp. rate 18, height 6' 1.5" (1.867 m), weight 211 lb (95.709 kg), SpO2 95.00%., Body mass index is 27.46 kg/(m^2). General: Well developed, well nourished, in no acute distress. Head: Normocephalic, atraumatic, eyes without discharge, sclera non-icteric, nares are congested. Bilateral auditory canals clear, TM's are without perforation, pearly grey with reflective cone of light bilaterally. Maxillary TTP. Oral cavity moist, dentition normal. Posterior pharynx with post nasal drip and mild erythema. No peritonsillar abscess or tonsillar exudate. Uvula midline.  Neck: Supple. No thyromegaly. Full ROM. No lymphadenopathy. Lungs: Clear to auscultation bilaterally without wheezes, rales, or rhonchi. Breathing is unlabored.  Heart: RRR with S1 S2. No murmurs, rubs, or gallops appreciated. Msk:  Strength and tone normal for age. Extremities: No clubbing or cyanosis. No edema. Neuro: Alert and oriented X 3. Moves all extremities spontaneously. CNII-XII grossly in tact. Psych:  Responds to questions appropriately with a normal affect.    Labs: Results for orders placed in visit on 06/01/13  POCT RAPID STREP A (OFFICE)      Result Value Range   Rapid Strep A Screen Negative  Negative  Throat culture pending      Assessment & Plan:  52 year old male with sinusitis -Augmentin 875/125 mg 1 po bid #20 no RF -Atrovent NS 0.06% 2 sprays each nare bid prn #1 no RF -Hycodan #4oz 1 tsp po q 4-6 hours prn cough no RF SED -Mucinex -Rest/fluids -RTC precautions   Eula Listen, PA-C [facility] [address] [place], [place] [address] [phone]

## 2013-06-02 NOTE — Telephone Encounter (Signed)
Dr Neva Seat, it looks like pt is requesting this from [facility] now (he should have RFs left but at local pharm).

## 2013-06-02 NOTE — Telephone Encounter (Signed)
Done. Thanks.

## 2013-06-03 LAB — CULTURE, GROUP A STREP: Organism ID, Bacteria: NORMAL

## 2013-06-17 ENCOUNTER — Telehealth: Payer: Self-pay

## 2013-06-17 NOTE — Telephone Encounter (Signed)
Patient is getting ready to travel internationally, and is a patient of dr Mancel Bale would like to know if we could prescribe him some Lorrin Mais if possible said that dr Carlota Raspberry usually does this for him [phone]

## 2013-08-07 ENCOUNTER — Other Ambulatory Visit: Payer: Self-pay | Admitting: Family Medicine

## 2013-09-18 ENCOUNTER — Ambulatory Visit (INDEPENDENT_AMBULATORY_CARE_PROVIDER_SITE_OTHER): Payer: BC Managed Care – PPO | Admitting: Family Medicine

## 2013-09-18 VITALS — BP 126/78 | HR 81 | Temp 98.0°F | Resp 17 | Ht 73.5 in | Wt 211.0 lb

## 2013-09-18 DIAGNOSIS — R509 Fever, unspecified: Secondary | ICD-10-CM

## 2013-09-18 DIAGNOSIS — R21 Rash and other nonspecific skin eruption: Secondary | ICD-10-CM

## 2013-09-18 DIAGNOSIS — J029 Acute pharyngitis, unspecified: Secondary | ICD-10-CM

## 2013-09-18 DIAGNOSIS — R059 Cough, unspecified: Secondary | ICD-10-CM

## 2013-09-18 DIAGNOSIS — R05 Cough: Secondary | ICD-10-CM

## 2013-09-18 LAB — POCT RAPID STREP A (OFFICE): Rapid Strep A Screen: NEGATIVE

## 2013-09-18 MED ORDER — AMOXICILLIN-POT CLAVULANATE 875-125 MG PO TABS: 1.0000 | ORAL_TABLET | Freq: Two times a day (BID) | ORAL | Status: DC

## 2013-09-18 NOTE — Progress Notes (Signed)
° °  Subjective:   This chart was scribed for Robyn Haber, MD by Forrestine Him, [facility] Scribe. This patient was seen in room 9 and the patient's care was started 8:31 AM.    Patient ID: Paul Rich, male    DOB: [birth year], 53 y.o.   MRN: [id number]  HPI  HPI Comments: Paul Rich is a 53 y.o. male who presents to [facility] complaining of cough x 8 days. He also reports a fever, sore throat, and diaphoresis as associated symptoms. He has tried Nyquil with mild temporary improvement. He states his girlfriend currently has the same symptoms and was given antibiotics for treatment. Pt has no other pertinent medical history. No other concerns this visit.  He has been using topical Imiquomod for a Molluscum rash to the abdomen, legs, and groin.  Hydrographic surveyor for [employer]  Review of Systems  Constitutional: Positive for fever and diaphoresis. Negative for chills.  HENT: Positive for sore throat. Negative for congestion.   Eyes: Negative for redness.  Respiratory: Positive for cough.   Skin: Negative for rash.  Psychiatric/Behavioral: Negative for confusion.    Past Medical History  Diagnosis Date   Other and unspecified hyperlipidemia    Hemorrhage of rectum and anus    AV block    Childhood asthma    Environmental allergies    Allergy     Triage Vitals: BP 126/78   Pulse 81   Temp(Src) 98 F (36.7 C) (Oral)   Resp 17   Ht 6' 1.5" (1.867 m)   Wt 211 lb (95.709 kg)   BMI 27.46 kg/m2   SpO2 95%   Objective:   Physical Exam  Nursing note and vitals reviewed. Constitutional: He is oriented to person, place, and time. He appears well-developed and well-nourished.  HENT:  Head: Normocephalic and atraumatic.  Mouth/Throat: Posterior oropharyngeal erythema present. No oropharyngeal exudate or posterior oropharyngeal edema.  Eyes: EOM are normal.  Neck: Normal range of motion.  Cardiovascular: Normal rate, regular rhythm  and normal heart sounds.  Exam reveals no gallop and no friction rub.   No murmur heard. Pulmonary/Chest: Effort normal. No respiratory distress. He has no wheezes. He has rales.  Rhales at the bases  Musculoskeletal: Normal range of motion.  Neurological: He is alert and oriented to person, place, and time.  Skin: Skin is warm and dry.  Psychiatric: He has a normal mood and affect. His behavior is normal.   Results for orders placed in visit on 09/18/13  POCT RAPID STREP A (OFFICE)      Result Value Ref Range   Rapid Strep A Screen Negative  Negative     Assessment & Plan:  Acute pharyngitis - Plan: POCT rapid strep A, Culture, Group A Strep, amoxicillin-clavulanate (AUGMENTIN) 875-125 MG per tablet  Fever, unspecified - Plan: amoxicillin-clavulanate (AUGMENTIN) 875-125 MG per tablet  Cough - Plan: amoxicillin-clavulanate (AUGMENTIN) 875-125 MG per tablet  Rash and nonspecific skin eruption - Plan: Culture, Group A Strep, amoxicillin-clavulanate (AUGMENTIN) 875-125 MG per tablet  Signed, Robyn Haber, MD   I personally performed the services described in this documentation, which was scribed in my presence. The recorded information has been reviewed and is accurate.

## 2013-09-20 ENCOUNTER — Other Ambulatory Visit: Payer: Self-pay | Admitting: Family Medicine

## 2013-09-20 ENCOUNTER — Telehealth: Payer: Self-pay

## 2013-09-20 DIAGNOSIS — R21 Rash and other nonspecific skin eruption: Secondary | ICD-10-CM

## 2013-09-20 LAB — CULTURE, GROUP A STREP: Organism ID, Bacteria: NORMAL

## 2013-09-20 MED ORDER — PREDNISONE 20 MG PO TABS: ORAL_TABLET | ORAL | Status: DC

## 2013-09-20 NOTE — Telephone Encounter (Signed)
Spoke with pt. He says that the rash that he had is not any better at all. Wants to know what else he can try, is there any creams or anything? Please advise.

## 2013-09-20 NOTE — Telephone Encounter (Signed)
Message copied by Constance Goltz on Mon Sep 20, 2013  3:35 PM ------      Message from: Robyn Haber      Created: Mon Sep 20, 2013  3:18 PM       Please inform patient of normal result ------

## 2013-11-15 ENCOUNTER — Other Ambulatory Visit: Payer: Self-pay | Admitting: Family Medicine

## 2014-02-21 ENCOUNTER — Telehealth: Payer: Self-pay

## 2014-02-23 ENCOUNTER — Other Ambulatory Visit: Payer: Self-pay | Admitting: Family Medicine

## 2014-02-23 DIAGNOSIS — N529 Male erectile dysfunction, unspecified: Secondary | ICD-10-CM

## 2014-02-24 NOTE — Telephone Encounter (Signed)
Will refill once, but OV needed before further refills as last seen by me in July 2014.

## 2014-02-28 ENCOUNTER — Telehealth: Payer: Self-pay | Admitting: *Deleted

## 2014-02-28 MED ORDER — PRAVASTATIN SODIUM 20 MG PO TABS: 20.0000 mg | ORAL_TABLET | Freq: Every day | ORAL | Status: DC

## 2014-02-28 NOTE — Telephone Encounter (Signed)
Meds ordered this encounter  Medications  . pravastatin (PRAVACHOL) 20 MG tablet    Sig: Take 1 tablet (20 mg total) by mouth daily.    Dispense:  30 tablet    Refill:  1    If pt needs 90 day supply, needs an OV first.    Order Specific Question:  Supervising Provider    Answer:  Tami Lin P [[id number]]

## 2014-02-28 NOTE — Telephone Encounter (Signed)
Pt advised.

## 2014-02-28 NOTE — Telephone Encounter (Signed)
Pt has an appt with Dr. Carlota Raspberry 11/23. Can he have refills until this appt?

## 2014-02-28 NOTE — Telephone Encounter (Signed)
Notified pt of need for f/up and transferred to Scheduling for appt.

## 2014-02-28 NOTE — Telephone Encounter (Signed)
Dr. Carlota Raspberry,  Pt called to schedule a CPE appointment with you to get his medication refilled. I let him know that you would not have a CPE appointment until the beginning of the year and that your next appointment for an OV would be November 30th. He would like you to call him to discuss your scheduling and how he is supposed to get his medication refilled. The walk-in clinic was offered to him, patient states that this is not an option. Please call  [phone] OK to leave a message.

## 2014-04-25 ENCOUNTER — Ambulatory Visit: Payer: BC Managed Care – PPO | Admitting: Family Medicine

## 2015-05-05 ENCOUNTER — Encounter: Payer: Self-pay | Admitting: Gastroenterology

## 2015-05-08 ENCOUNTER — Encounter: Payer: Self-pay | Admitting: Gastroenterology

## 2015-05-15 ENCOUNTER — Ambulatory Visit (INDEPENDENT_AMBULATORY_CARE_PROVIDER_SITE_OTHER): Payer: BLUE CROSS/BLUE SHIELD | Admitting: Family Medicine

## 2015-05-15 ENCOUNTER — Encounter: Payer: Self-pay | Admitting: Family Medicine

## 2015-05-15 VITALS — BP 116/74 | HR 67 | Temp 98.2°F | Resp 16 | Ht 74.0 in | Wt 219.0 lb

## 2015-05-15 DIAGNOSIS — Z1159 Encounter for screening for other viral diseases: Secondary | ICD-10-CM | POA: Diagnosis not present

## 2015-05-15 DIAGNOSIS — N529 Male erectile dysfunction, unspecified: Secondary | ICD-10-CM

## 2015-05-15 DIAGNOSIS — E785 Hyperlipidemia, unspecified: Secondary | ICD-10-CM | POA: Diagnosis not present

## 2015-05-15 MED ORDER — OMEGA-3-ACID ETHYL ESTERS 1 G PO CAPS: 2.0000 g | ORAL_CAPSULE | Freq: Two times a day (BID) | ORAL | Status: DC

## 2015-05-15 MED ORDER — PRAVASTATIN SODIUM 20 MG PO TABS: 20.0000 mg | ORAL_TABLET | Freq: Every day | ORAL | Status: DC

## 2015-05-15 MED ORDER — TADALAFIL 5 MG PO TABS: 2.5000 mg | ORAL_TABLET | Freq: Every day | ORAL | Status: DC | PRN

## 2015-05-15 NOTE — Progress Notes (Signed)
Subjective:  By signing my name below, I, Paul Rich, attest that this documentation has been prepared under the direction and in the presence of Merri Ray, MD.  Electronically Signed: Thea Alken, ED Scribe. 05/15/2015. 8:07 AM.  Patient ID: Doristine Section, male    DOB: [birth year], 54 y.o.   MRN: [id number]  HPI  Chief Complaint  Patient presents with  . Medication Refill    LOVAZA, pravastatin and Cialis x 90 day    HPI Comments: Buckley Segovia is a 54 y.o. male who presents to the [facility] for a medication refill.    Pt is not fasting today.   Hyperlipidemia   Lab Results  Component Value Date   CHOL 164 03/17/2012   HDL 34* 03/17/2012   LDLCALC 90 03/17/2012   TRIG 198* 03/17/2012   CHOLHDL 4.8 03/17/2012   Lab Results  Component Value Date   ALT 37 03/17/2012   AST 30 03/17/2012   ALKPHOS 47 03/17/2012   BILITOT 1.3* 03/17/2012   Last saw him for Hyperlipidemia in 2014. He had brought outside lab work from 09/2012.   Total cholesterol 164  HDL 33.  Trig 235  LDL 84.   Takes Pravachol and Lovaza.   Pt no longer seen by [facility] and is now followed [facility]. He had a physical with them 3 days ago and had lab work with them on 11/18.   Total cholesterol: 211 HDL: 33  Trig: 364  LDL: 105  Pt has been out of pravastatin for 3 weeks. He has not been exercising regular due to work and traveling. Stress test 32.3 L/per min exercise 9.4 mets.   Erectile dysfunction  Last visit prescribed Viagra. He had been followed by [facility] have been receiving supplements there and had lab testing done through there as well. Requesting a refill of Cialis. Pt states [facility] is also monitoring testosterone and prostate. He is taking testosterone supplements.  Pt takes half tablet of Cialis 5mg  every other day. He denies adverse effects with Cialis.  Lab work 11/18  Testosterone: 168  LFT overall normal  ALT  borderline at 47   CBC normal  Borderline HGB 17.2  PSA normal at 0.83  Planned on physical in 6 months after July visit. Had been schedule for CPE last November, but last OV with me 12/2012  Patient Active Problem List   Diagnosis Date Noted  . Allergic rhinitis due to other allergen 05/24/2011  . Allergy to shellfish 05/24/2011  . OSA (obstructive sleep apnea) 05/21/2011  . HEMORRHOIDS 10/12/2008  . COLONIC POLYPS 08/29/2008  . HYPERLIPIDEMIA 08/29/2008  . RECTAL BLEEDING 08/29/2008   Past Medical History  Diagnosis Date  . Other and unspecified hyperlipidemia   . Hemorrhage of rectum and anus   . AV block   . Childhood asthma   . Environmental allergies   . Allergy    Past Surgical History  Procedure Laterality Date  . Hernia repair  2012   Allergies  Allergen Reactions  . Shellfish Allergy Anaphylaxis and Swelling    Of swelling of eyes  . Mold Extract [Trichophyton]    Prior to Admission medications   Medication Sig Start Date End Date Taking? Authorizing Provider  amoxicillin-clavulanate (AUGMENTIN) 875-125 MG per tablet Take 1 tablet by mouth 2 (two) times daily. 09/18/13   Robyn Haber, MD  aspirin 81 MG tablet Take 81 mg by mouth daily.      Historical Provider, MD  Multiple  Vitamin (MULTIVITAMIN) tablet Take 1 tablet by mouth daily.      Historical Provider, MD  omega-3 acid ethyl esters (LOVAZA) 1 G capsule Take 2 capsules (2 g total) by mouth 2 (two) times daily. 01/18/13   Wendie Agreste, MD  pravastatin (PRAVACHOL) 20 MG tablet Take 1 tablet (20 mg total) by mouth daily. 02/28/14   Chelle Jeffery, PA-C  predniSONE (DELTASONE) 20 MG tablet 2 daily with food 09/20/13   Robyn Haber, MD  VIAGRA 100 MG tablet TAKE ONE HALF TO 1 TABLET (50 TO 100 MG) DAILY AS NEEDED FOR ERECTILE DYSFUNCTION 02/24/14   Wendie Agreste, MD   Social History   Social History  . Marital Status: Married    Spouse Name: N/A  . Number of Children: N/A  . Years of Education: N/A    Occupational History  . HR exec-business    Social History Main Topics  . Smoking status: Never Smoker   . Smokeless tobacco: Not on file  . Alcohol Use: Yes     Comment: 3-4 a week   . Drug Use: No  . Sexual Activity: Yes   Other Topics Concern  . Not on file   Social History Narrative  . No narrative on file   Review of Systems  Constitutional: Negative for fatigue and unexpected weight change.  Eyes: Negative for visual disturbance.  Respiratory: Negative for cough, chest tightness and shortness of breath.   Cardiovascular: Negative for chest pain, palpitations and leg swelling.  Gastrointestinal: Negative for abdominal pain and blood in stool.  Neurological: Negative for dizziness, light-headedness and headaches.   Objective:   Physical Exam  Constitutional: He is oriented to person, place, and time. He appears well-developed and well-nourished. No distress.  HENT:  Head: Normocephalic and atraumatic.  Eyes: Conjunctivae and EOM are normal. Pupils are equal, round, and reactive to light.  Neck: Neck supple. No JVD present. Carotid bruit is not present.  Cardiovascular: Normal rate, regular rhythm and normal heart sounds.   No murmur heard. Pulmonary/Chest: Effort normal and breath sounds normal. He has no rales.  Musculoskeletal: Normal range of motion. He exhibits no edema.  Neurological: He is alert and oriented to person, place, and time.  Skin: Skin is warm and dry.  Psychiatric: He has a normal mood and affect. His behavior is normal.  Nursing note and vitals reviewed.   Filed Vitals:   05/15/15 0811  BP: 116/74  Pulse: 67  Temp: 98.2 F (36.8 C)  TempSrc: Oral  Resp: 16  Height: 6\' 2"  (1.88 m)  Weight: 219 lb (99.338 kg)  SpO2: 98%   Assessment & Plan:   Von Lownes is a 54 y.o. male Hyperlipidemia - Plan: COMPLETE METABOLIC PANEL WITH GFR, Lipid panel, pravastatin (PRAVACHOL) 20 MG tablet, omega-3 acid ethyl esters (LOVAZA) 1 G capsule  -  borderline on outside labs. Off meds recently. Will restart prior doses, then fasting lab order in next few months.   Pan on cpe in next 6 months  Erectile dysfunction, unspecified erectile dysfunction type - Plan: tadalafil (CIALIS) 5 MG tablet  - recent testosterone level ok.   -refilled cialis for daily use - lowest effective dose.   Need for hepatitis C screening test - Plan: Hepatitis C antibody    Meds ordered this encounter  Medications  . tadalafil (CIALIS) 5 MG tablet    Sig: Take 0.5-1 tablets (2.5-5 mg total) by mouth daily as needed for erectile dysfunction.    Dispense:  90 tablet    Refill:  1  . pravastatin (PRAVACHOL) 20 MG tablet    Sig: Take 1 tablet (20 mg total) by mouth daily.    Dispense:  90 tablet    Refill:  1    If pt needs 90 day supply, needs an OV first.  . omega-3 acid ethyl esters (LOVAZA) 1 G capsule    Sig: Take 2 capsules (2 g total) by mouth 2 (two) times daily.    Dispense:  270 capsule    Refill:  1   Patient Instructions  Lab visit only in 6 weeks, then physical in next 6 months.       I personally performed the services described in this documentation, which was scribed in my presence. The recorded information has been reviewed and considered, and addended by me as needed.

## 2015-05-15 NOTE — Patient Instructions (Signed)
Lab visit only in 6 weeks, then physical in next 6 months.

## 2015-05-23 ENCOUNTER — Other Ambulatory Visit: Payer: Self-pay

## 2015-05-23 DIAGNOSIS — E785 Hyperlipidemia, unspecified: Secondary | ICD-10-CM

## 2015-05-23 MED ORDER — OMEGA-3-ACID ETHYL ESTERS 1 G PO CAPS
2.0000 g | ORAL_CAPSULE | Freq: Two times a day (BID) | ORAL | Status: DC
Start: 2015-05-23 — End: 2015-08-23

## 2015-08-07 ENCOUNTER — Encounter: Payer: BLUE CROSS/BLUE SHIELD | Admitting: Family Medicine

## 2015-08-09 ENCOUNTER — Encounter: Payer: BLUE CROSS/BLUE SHIELD | Admitting: Family Medicine

## 2015-08-23 ENCOUNTER — Encounter: Payer: Self-pay | Admitting: Family Medicine

## 2015-08-23 ENCOUNTER — Ambulatory Visit (INDEPENDENT_AMBULATORY_CARE_PROVIDER_SITE_OTHER): Payer: BLUE CROSS/BLUE SHIELD | Admitting: Family Medicine

## 2015-08-23 VITALS — BP 100/72 | HR 65 | Temp 97.7°F | Resp 16 | Ht 74.0 in | Wt 221.0 lb

## 2015-08-23 DIAGNOSIS — Z0000 Encounter for general adult medical examination without abnormal findings: Secondary | ICD-10-CM | POA: Diagnosis not present

## 2015-08-23 DIAGNOSIS — N529 Male erectile dysfunction, unspecified: Secondary | ICD-10-CM | POA: Diagnosis not present

## 2015-08-23 DIAGNOSIS — Z125 Encounter for screening for malignant neoplasm of prostate: Secondary | ICD-10-CM | POA: Diagnosis not present

## 2015-08-23 DIAGNOSIS — Z114 Encounter for screening for human immunodeficiency virus [HIV]: Secondary | ICD-10-CM | POA: Diagnosis not present

## 2015-08-23 DIAGNOSIS — E785 Hyperlipidemia, unspecified: Secondary | ICD-10-CM

## 2015-08-23 DIAGNOSIS — K625 Hemorrhage of anus and rectum: Secondary | ICD-10-CM | POA: Diagnosis not present

## 2015-08-23 DIAGNOSIS — Z1211 Encounter for screening for malignant neoplasm of colon: Secondary | ICD-10-CM | POA: Diagnosis not present

## 2015-08-23 DIAGNOSIS — Z1159 Encounter for screening for other viral diseases: Secondary | ICD-10-CM | POA: Diagnosis not present

## 2015-08-23 LAB — LIPID PANEL
CHOL/HDL RATIO: 5.6 ratio — AB (ref <=5.0)
CHOLESTEROL: 192 mg/dL (ref 125–200)
HDL: 34 mg/dL — AB (ref >=40)
LDL Cholesterol: 89 mg/dL (ref <130)
TRIGLYCERIDES: 347 mg/dL — AB (ref <150)
VLDL: 69 mg/dL — AB (ref <30)

## 2015-08-23 LAB — HEPATITIS C ANTIBODY: HCV Ab: NEGATIVE

## 2015-08-23 LAB — HIV ANTIBODY (ROUTINE TESTING W REFLEX): HIV: NONREACTIVE

## 2015-08-23 MED ORDER — OMEGA-3-ACID ETHYL ESTERS 1 G PO CAPS: 2.0000 g | ORAL_CAPSULE | Freq: Two times a day (BID) | ORAL | Status: DC

## 2015-08-23 MED ORDER — TADALAFIL 5 MG PO TABS: 2.5000 mg | ORAL_TABLET | Freq: Every day | ORAL | Status: DC | PRN

## 2015-08-23 MED ORDER — PRAVASTATIN SODIUM 20 MG PO TABS: 20.0000 mg | ORAL_TABLET | Freq: Every day | ORAL | Status: DC

## 2015-08-23 NOTE — Progress Notes (Signed)
   Subjective:    Patient ID: Paul Rich, male    DOB: 10/24/1960, 55 y.o.   MRN: [id number]  HPI    Review of Systems  Constitutional: Negative.   HENT: Negative.   Eyes: Negative.   Respiratory: Negative.   Cardiovascular: Negative.   Gastrointestinal: Positive for blood in stool.  Endocrine: Negative.   Genitourinary: Negative.   Musculoskeletal: Positive for arthralgias.  Skin: Negative.   Allergic/Immunologic: Negative.   Neurological: Negative.   Hematological: Negative.   Psychiatric/Behavioral: Negative.        Objective:   Physical Exam        Assessment & Plan:

## 2015-08-23 NOTE — Patient Instructions (Addendum)
I will refer you to gastroenterologist for colonoscopy and to discuss blood in stool.  Return to the clinic or go to the nearest emergency room if any of your symptoms worsen or new symptoms occur.   IF you received an x-ray today, you will receive an invoice from [facility]. Please contact [facility] at [phone] with questions or concerns regarding your invoice.   IF you received labwork today, you will receive an invoice from [employer]. Please contact [facility] at [phone] with questions or concerns regarding your invoice.   Our billing staff will not be able to assist you with questions regarding bills from these companies.  You will be contacted with the lab results as soon as they are available. The fastest way to get your results is to activate your My Chart account. Instructions are located on the last page of this paperwork. If you have not heard from [place] regarding the results in 2 weeks, please contact this office.     Keeping you healthy  Get these tests  Blood pressure- Have your blood pressure checked once a year by your healthcare provider.  Normal blood pressure is 120/80  Weight- Have your body mass index (BMI) calculated to screen for obesity.  BMI is a measure of body fat based on height and weight. You can also calculate your own BMI at ViewBanking.[email or web address].  Cholesterol- Have your cholesterol checked every year.  Diabetes- Have your blood sugar checked regularly if you have high blood pressure, high cholesterol, have a family history of diabetes or if you are overweight.  Screening for Colon Cancer- Colonoscopy starting at age 40.  Screening may begin sooner depending on your family history and other health conditions. Follow up colonoscopy as directed by your Gastroenterologist.  Screening for Prostate Cancer- Both blood work (PSA) and a rectal exam help screen for Prostate Cancer.  Screening begins at age 44  with African-American men and at age 46 with Caucasian men.  Screening may begin sooner depending on your family history.  Take these medicines  Aspirin- One aspirin daily can help prevent Heart disease and Stroke.  Flu shot- Every fall.  Tetanus- Every 10 years.  Zostavax- Once after the age of 6 to prevent Shingles.  Pneumonia shot- Once after the age of 64; if you are younger than 41, ask your healthcare provider if you need a Pneumonia shot.  Take these steps  Don't smoke- If you do smoke, talk to your doctor about quitting.  For tips on how to quit, go to www.smokefree.gov or call 1-800-QUIT-NOW.  Be physically active- Exercise 5 days a week for at least 30 minutes.  If you are not already physically active start slow and gradually work up to 30 minutes of moderate physical activity.  Examples of moderate activity include walking briskly, mowing the yard, dancing, swimming, bicycling, etc.  Eat a healthy diet- Eat a variety of healthy food such as fruits, vegetables, low fat milk, low fat cheese, yogurt, lean meant, poultry, fish, beans, tofu, etc. For more information go to www.thenutritionsource.org  Drink alcohol in moderation- Limit alcohol intake to less than two drinks a day. Never drink and drive.  Dentist- Brush and floss twice daily; visit your dentist twice a year.  Depression- Your emotional health is as important as your physical health. If you're feeling down, or losing interest in things you would normally enjoy please talk to your healthcare provider.  Eye exam- Visit your eye doctor every year.  Safe sex-  If you may be exposed to a sexually transmitted infection, use a condom.  Seat belts- Seat belts can save your life; always wear one.  Smoke/Carbon Monoxide detectors- These detectors need to be installed on the appropriate level of your home.  Replace batteries at least once a year.  Skin cancer- When out in the sun, cover up and use sunscreen 15 SPF or  higher.  Violence- If anyone is threatening you, please tell your healthcare provider.  Living Will/ Health care power of attorney- Speak with your healthcare provider and family.

## 2015-08-23 NOTE — Progress Notes (Signed)
Subjective:  By signing my name below, I, Moises Blood, attest that this documentation has been prepared under the direction and in the presence of Merri Ray, MD. Electronically Signed: Moises Blood, [place]. 08/23/2015 , 9:44 AM .  Patient was seen in Room 23 .   Patient ID: Paul Rich, male    DOB: 03/23/1961, 55 y.o.   MRN: [id number] Chief Complaint  Patient presents with  . Annual Exam   HPI Paul Rich is a 55 y.o. male  Here for annual physical. He's fasting today. He generally feels pretty healthy.   Cancer Screening Colonoscopy: May 2010, diverticula with internal hemorrhoids, repeat 5 years. Done by Dr. Deatra Ina. Pt has not received any contact from GI for repeat colonoscopy.   Prostate: Has had prostate testing through [facility]; now [place] to monitor testosterone and prostate. Last PSA check was about a year ago. He noticed some blood in his stool after straining and heavy leg work out (ie, squats). He denies feeling any hemorrhoids.   Erectile Dysfunction Pt has used Cialis for erectile dysfunction. He takes about 3 a week, as needed.   Immunizations Immunization History  Administered Date(s) Administered  . Influenza Split 03/04/2011, 03/03/2012, 04/04/2015  . Tdap 03/23/2012    Depression Depression screen [facility] 2/9 08/23/2015 05/15/2015  Decreased Interest 0 0  Down, Depressed, Hopeless 0 0  PHQ - 2 Score 0 0   Exercise He does about 3-4 days of exercise with cardio and machines.   Vision  Visual Acuity Screening   Right eye Left eye Both eyes  Without correction: 20/40 20/40 20/20   With correction:      Last optometrist visit about 2 months ago.   HLD Lab Results  Component Value Date   CHOL 164 03/17/2012   HDL 34* 03/17/2012   LDLCALC 90 03/17/2012   TRIG 198* 03/17/2012   CHOLHDL 4.8 03/17/2012   Lab Results  Component Value Date   ALT 37 03/17/2012   AST 30 03/17/2012   ALKPHOS 47 03/17/2012   BILITOT 1.3*  03/17/2012   He takes aspirin 81mg  qd.  He also takes pravachol 20mg  qd.  Also takes Lovaza: had borderline readings from outside labs. Been off medications, restarted last visit in December.   Future orders placed in December, not drawn yet.   HIV screening Hep C antibody: Future orders placed in December.  He denies any sexual contact outside his current relationship.   Patient Active Problem List   Diagnosis Date Noted  . Allergic rhinitis due to other allergen 05/24/2011  . Allergy to shellfish 05/24/2011  . OSA (obstructive sleep apnea) 05/21/2011  . HEMORRHOIDS 10/12/2008  . COLONIC POLYPS 08/29/2008  . HYPERLIPIDEMIA 08/29/2008  . RECTAL BLEEDING 08/29/2008   Past Medical History  Diagnosis Date  . Other and unspecified hyperlipidemia   . Hemorrhage of rectum and anus   . AV block   . Childhood asthma   . Environmental allergies   . Allergy    Past Surgical History  Procedure Laterality Date  . Hernia repair  2012   Allergies  Allergen Reactions  . Shellfish Allergy Anaphylaxis and Swelling    Of swelling of eyes  . Mold Extract [Trichophyton]    Prior to Admission medications   Medication Sig Start Date End Date Taking? Authorizing Provider  aspirin 81 MG tablet Take 81 mg by mouth daily.      Historical Provider, MD  Multiple Vitamin (MULTIVITAMIN) tablet Take 1 tablet by mouth daily.  Historical Provider, MD  omega-3 acid ethyl esters (LOVAZA) 1 G capsule Take 2 capsules (2 g total) by mouth 2 (two) times daily. 05/23/15   Wendie Agreste, MD  pravastatin (PRAVACHOL) 20 MG tablet Take 1 tablet (20 mg total) by mouth daily. 05/15/15   Wendie Agreste, MD  predniSONE (DELTASONE) 20 MG tablet 2 daily with food Patient not taking: Reported on 05/15/2015 09/20/13   Robyn Haber, MD  tadalafil (CIALIS) 5 MG tablet Take 0.5-1 tablets (2.5-5 mg total) by mouth daily as needed for erectile dysfunction. 05/15/15   Wendie Agreste, MD  VIAGRA 100 MG tablet  TAKE ONE HALF TO 1 TABLET (50 TO 100 MG) DAILY AS NEEDED FOR ERECTILE DYSFUNCTION 02/24/14   Wendie Agreste, MD   Social History   Social History  . Marital Status: Married    Spouse Name: N/A  . Number of Children: N/A  . Years of Education: N/A   Occupational History  . HR exec-business    Social History Main Topics  . Smoking status: Never Smoker   . Smokeless tobacco: Not on file  . Alcohol Use: 0.0 oz/week    0 Standard drinks or equivalent per week     Comment: 4-5  . Drug Use: No  . Sexual Activity: Yes   Other Topics Concern  . Not on file   Social History Narrative   Divorced   Exercise: Tes, 4 times a week   Education: PHD   Review of Systems 13 point ros, blood in stool and joint pain    Objective:   Physical Exam  Constitutional: He is oriented to person, place, and time. He appears well-developed and well-nourished.  HENT:  Head: Normocephalic and atraumatic.  Right Ear: External ear normal.  Left Ear: External ear normal.  Mouth/Throat: Oropharynx is clear and moist.  Eyes: Conjunctivae and EOM are normal. Pupils are equal, round, and reactive to light.  Neck: Normal range of motion. Neck supple. No thyromegaly present.  Cardiovascular: Normal rate, regular rhythm, normal heart sounds and intact distal pulses.   Pulmonary/Chest: Effort normal and breath sounds normal. No respiratory distress. He has no wheezes.  Abdominal: Soft. He exhibits no distension. There is no tenderness. Hernia confirmed negative in the right inguinal area and confirmed negative in the left inguinal area.  Genitourinary: Prostate normal.  Musculoskeletal: Normal range of motion. He exhibits no edema or tenderness.  Lymphadenopathy:    He has no cervical adenopathy.  Neurological: He is alert and oriented to person, place, and time. He has normal reflexes.  Skin: Skin is warm and dry.  Psychiatric: He has a normal mood and affect. His behavior is normal.  Vitals  reviewed.   Filed Vitals:   08/23/15 0840  BP: 100/72  Pulse: 65  Temp: 97.7 F (36.5 C)  TempSrc: Oral  Resp: 16  Height: 6\' 2"  (1.88 m)  Weight: 221 lb (100.245 kg)  SpO2: 96%      Assessment & Plan:   Paul Rich is a 55 y.o. male Annual physical exam  --anticipatory guidance as below in AVS, screening labs above. Health maintenance items as above in HPI discussed/recommended as applicable.   Screen for colon cancer - Plan: Ambulatory referral to Gastroenterology BRBPR (bright red blood per rectum) - Plan: Ambulatory referral to Gastroenterology  Hyperlipidemia - Plan: pravastatin (PRAVACHOL) 20 MG tablet, omega-3 acid ethyl esters (LOVAZA) 1 g capsule, COMPLETE METABOLIC PANEL WITH GFR, Lipid panel  -lipids pending, no change in meds  for now.   Screening for HIV (human immunodeficiency virus) - Plan: HIV antibody  Erectile dysfunction, unspecified erectile dysfunction type - Plan: tadalafil (CIALIS) 5 MG tablet  -continue [place] as needed, SED.  Screening for prostate cancer - Plan: PSA  -We discussed pros and cons of prostate cancer screening, and after this discussion, he chose to have screening done, especially with testosterone supplementation.  PSA obtained, and no concerning findings on DRE.   Need for hepatitis C screening test - Plan: Hepatitis C antibody   Meds ordered this encounter  Medications  . niacinamide 500 MG tablet    Sig: Take 500 mg by mouth 2 (two) times daily with a meal.  . PRESCRIPTION MEDICATION    Sig: HCG injectable taking 2 ml  . testosterone cypionate (DEPOTESTOSTERONE CYPIONATE) 200 MG/ML injection    Sig: Inject into the muscle every 14 (fourteen) days.  . tadalafil (CIALIS) 5 MG tablet    Sig: Take 0.5-1 tablets (2.5-5 mg total) by mouth daily as needed for erectile dysfunction.    Dispense:  90 tablet    Refill:  1  . pravastatin (PRAVACHOL) 20 MG tablet    Sig: Take 1 tablet (20 mg total) by mouth daily.    Dispense:  90  tablet    Refill:  1    If pt needs 90 day supply, needs an OV first.  . omega-3 acid ethyl esters (LOVAZA) 1 g capsule    Sig: Take 2 capsules (2 g total) by mouth 2 (two) times daily.    Dispense:  360 capsule    Refill:  1   Patient Instructions    I will refer you to gastroenterologist for colonoscopy and to discuss blood in stool.  Return to the clinic or go to the nearest emergency room if any of your symptoms worsen or new symptoms occur.   IF you received an x-ray today, you will receive an invoice from [facility]. Please contact [facility] at [phone] with questions or concerns regarding your invoice.   IF you received labwork today, you will receive an invoice from [employer]. Please contact [facility] at [phone] with questions or concerns regarding your invoice.   Our billing staff will not be able to assist you with questions regarding bills from these companies.  You will be contacted with the lab results as soon as they are available. The fastest way to get your results is to activate your My Chart account. Instructions are located on the last page of this paperwork. If you have not heard from [place] regarding the results in 2 weeks, please contact this office.     Keeping you healthy  Get these tests  Blood pressure- Have your blood pressure checked once a year by your healthcare provider.  Normal blood pressure is 120/80  Weight- Have your body mass index (BMI) calculated to screen for obesity.  BMI is a measure of body fat based on height and weight. You can also calculate your own BMI at ViewBanking.si.  Cholesterol- Have your cholesterol checked every year.  Diabetes- Have your blood sugar checked regularly if you have high blood pressure, high cholesterol, have a family history of diabetes or if you are overweight.  Screening for Colon Cancer- Colonoscopy starting at age 19.  Screening may begin  sooner depending on your family history and other health conditions. Follow up colonoscopy as directed by your Gastroenterologist.  Screening for Prostate Cancer- Both blood work (PSA) and a rectal exam  help screen for Prostate Cancer.  Screening begins at age 25 with African-American men and at age 61 with Caucasian men.  Screening may begin sooner depending on your family history.  Take these medicines  Aspirin- One aspirin daily can help prevent Heart disease and Stroke.  Flu shot- Every fall.  Tetanus- Every 10 years.  Zostavax- Once after the age of 62 to prevent Shingles.  Pneumonia shot- Once after the age of 62; if you are younger than 39, ask your healthcare provider if you need a Pneumonia shot.  Take these steps  Don't smoke- If you do smoke, talk to your doctor about quitting.  For tips on how to quit, go to www.smokefree.gov or call 1-800-QUIT-NOW.  Be physically active- Exercise 5 days a week for at least 30 minutes.  If you are not already physically active start slow and gradually work up to 30 minutes of moderate physical activity.  Examples of moderate activity include walking briskly, mowing the yard, dancing, swimming, bicycling, etc.  Eat a healthy diet- Eat a variety of healthy food such as fruits, vegetables, low fat milk, low fat cheese, yogurt, lean meant, poultry, fish, beans, tofu, etc. For more information go to www.thenutritionsource.org  Drink alcohol in moderation- Limit alcohol intake to less than two drinks a day. Never drink and drive.  Dentist- Brush and floss twice daily; visit your dentist twice a year.  Depression- Your emotional health is as important as your physical health. If you're feeling down, or losing interest in things you would normally enjoy please talk to your healthcare provider.  Eye exam- Visit your eye doctor every year.  Safe sex- If you may be exposed to a sexually transmitted infection, use a condom.  Seat belts- Seat belts  can save your life; always wear one.  Smoke/Carbon Monoxide detectors- These detectors need to be installed on the appropriate level of your home.  Replace batteries at least once a year.  Skin cancer- When out in the sun, cover up and use sunscreen 15 SPF or higher.  Violence- If anyone is threatening you, please tell your healthcare provider.  Living Will/ Health care power of attorney- Speak with your healthcare provider and family.    I personally performed the services described in this documentation, which was scribed in my presence. The recorded information has been reviewed and considered, and addended by me as needed.

## 2015-08-24 ENCOUNTER — Encounter: Payer: Self-pay | Admitting: Gastroenterology

## 2015-08-24 LAB — PSA: PSA: 0.86 ng/mL (ref <=4.00)

## 2015-08-28 NOTE — Addendum Note (Signed)
Addended by: Wyatt Haste on: 08/28/2015 07:37 AM   Modules accepted: Miquel Dunn

## 2015-09-10 NOTE — Addendum Note (Signed)
Addended by: Merri Ray R on: 09/10/2015 11:16 PM   Modules accepted: Orders

## 2015-09-19 ENCOUNTER — Telehealth: Payer: Self-pay

## 2015-09-19 NOTE — Telephone Encounter (Signed)
PA approved for generic Lovaza through 09/18/2018, case # [id number].

## 2015-10-20 ENCOUNTER — Ambulatory Visit (AMBULATORY_SURGERY_CENTER): Payer: Self-pay

## 2015-10-20 VITALS — Ht 74.0 in | Wt 223.0 lb

## 2015-10-20 DIAGNOSIS — Z1211 Encounter for screening for malignant neoplasm of colon: Secondary | ICD-10-CM

## 2015-10-20 MED ORDER — SUPREP BOWEL PREP KIT 17.5-3.13-1.6 GM/177ML PO SOLN: 1.0000 | Freq: Once | ORAL | Status: DC

## 2015-10-20 NOTE — Progress Notes (Signed)
No allergies to eggs or soy No diet meds No home oxygen No past problems with anesthesia  Has email and internet; declined emmi 

## 2015-10-27 ENCOUNTER — Encounter: Payer: Self-pay | Admitting: Gastroenterology

## 2015-10-31 ENCOUNTER — Encounter: Payer: Self-pay | Admitting: Gastroenterology

## 2015-11-09 ENCOUNTER — Ambulatory Visit (AMBULATORY_SURGERY_CENTER): Payer: BLUE CROSS/BLUE SHIELD | Admitting: Gastroenterology

## 2015-11-09 ENCOUNTER — Encounter: Payer: Self-pay | Admitting: Gastroenterology

## 2015-11-09 VITALS — BP 100/61 | HR 53 | Temp 96.8°F | Resp 18 | Ht 74.0 in | Wt 223.0 lb

## 2015-11-09 DIAGNOSIS — Z1211 Encounter for screening for malignant neoplasm of colon: Secondary | ICD-10-CM | POA: Diagnosis not present

## 2015-11-09 DIAGNOSIS — D120 Benign neoplasm of cecum: Secondary | ICD-10-CM | POA: Diagnosis not present

## 2015-11-09 MED ORDER — SODIUM CHLORIDE 0.9 % IV SOLN: 500.0000 mL | INTRAVENOUS | Status: DC

## 2015-11-09 NOTE — Progress Notes (Signed)
To PACU  Awake and alert.  Report to RN 

## 2015-11-09 NOTE — Patient Instructions (Signed)
Impression/recommendations:  Polyp (handout given) Diverticulosis (handout given) High Fiber diet (handout given)  YOU HAD AN ENDOSCOPIC PROCEDURE TODAY AT THE [facility]:   Refer to the procedure report that was given to you for any specific questions about what was found during the examination.  If the procedure report does not answer your questions, please call your gastroenterologist to clarify.  If you requested that your care partner not be given the details of your procedure findings, then the procedure report has been included in a sealed envelope for you to review at your convenience later.  YOU SHOULD EXPECT: Some feelings of bloating in the abdomen. Passage of more gas than usual.  Walking can help get rid of the air that was put into your GI tract during the procedure and reduce the bloating. If you had a lower endoscopy (such as a colonoscopy or flexible sigmoidoscopy) you may notice spotting of blood in your stool or on the toilet paper. If you underwent a bowel prep for your procedure, you may not have a normal bowel movement for a few days.  Please Note:  You might notice some irritation and congestion in your nose or some drainage.  This is from the oxygen used during your procedure.  There is no need for concern and it should clear up in a day or so.  SYMPTOMS TO REPORT IMMEDIATELY:   Following lower endoscopy (colonoscopy or flexible sigmoidoscopy):  Excessive amounts of blood in the stool  Significant tenderness or worsening of abdominal pains  Swelling of the abdomen that is new, acute  Fever of 100F or higher  For urgent or emergent issues, a gastroenterologist can be reached at any hour by calling [phone].   DIET: Your first meal following the procedure should be a small meal and then it is ok to progress to your normal diet. Heavy or fried foods are harder to digest and may make you feel nauseous or bloated.  Likewise, meals heavy in dairy and  vegetables can increase bloating.  Drink plenty of fluids but you should avoid alcoholic beverages for 24 hours.  ACTIVITY:  You should plan to take it easy for the rest of today and you should NOT DRIVE or use heavy machinery until tomorrow (because of the sedation medicines used during the test).    FOLLOW UP: Our staff will call the number listed on your records the next business day following your procedure to check on you and address any questions or concerns that you may have regarding the information given to you following your procedure. If we do not reach you, we will leave a message.  However, if you are feeling well and you are not experiencing any problems, there is no need to return our call.  We will assume that you have returned to your regular daily activities without incident.  If any biopsies were taken you will be contacted by phone or by letter within the next 1-3 weeks.  Please call us at [phone] if you have not heard about the biopsies in 3 weeks.    SIGNATURES/CONFIDENTIALITY: You and/or your care partner have signed paperwork which will be entered into your electronic medical record.  These signatures attest to the fact that that the information above on your After Visit Summary has been reviewed and is understood.  Full responsibility of the confidentiality of this discharge information lies with you and/or your care-partner. 

## 2015-11-09 NOTE — Op Note (Signed)
Paul Rich: Paul Rich Procedure Date: 11/09/2015 8:42 AM MRN: [id number] Endoscopist: Remo Lipps P. Havery Moros , MD Age: 55 Referring MD:  Date of Birth: [birth year] Gender: Male Procedure:                Colonoscopy Indications:              Screening for malignant neoplasm in the colon Medicines:                Monitored Anesthesia Care Procedure:                Pre-Anesthesia Assessment:                           - Prior to the procedure, a History and Physical                            was performed, and patient medications and                            allergies were reviewed. The patient's tolerance of                            previous anesthesia was also reviewed. The risks                            and benefits of the procedure and the sedation                            options and risks were discussed with the patient.                            All questions were answered, and informed consent                            was obtained. Prior Anticoagulants: The patient has                            taken aspirin, last dose was 1 day prior to                            procedure. ASA Grade Assessment: II - A patient                            with mild systemic disease. After reviewing the                            risks and benefits, the patient was deemed in                            satisfactory condition to undergo the procedure.                           After obtaining informed consent, the colonoscope  was passed under direct vision. Throughout the                            procedure, the patient's blood pressure, pulse, and                            oxygen saturations were monitored continuously. The                            Model CF-HQ190L [id number]) scope was introduced                            through the anus and advanced to the the cecum,                            identified by appendiceal orifice  and ileocecal                            valve. The colonoscopy was performed without                            difficulty. The patient tolerated the procedure                            well. The quality of the bowel preparation was                            adequate. The ileocecal valve, appendiceal orifice,                            and rectum were photographed. Scope In: 8:49:29 AM Scope Out: 9:03:32 AM Scope Withdrawal Time: 0 hours 12 minutes 23 seconds  Total Procedure Duration: 0 hours 14 minutes 3 seconds  Findings:                 The perianal and digital rectal examinations were                            normal.                           A 3 mm polyp was found in the cecum. The polyp was                            sessile. The polyp was removed with a cold snare.                            Resection and retrieval were complete.                           Many small and large-mouthed diverticula were found                            in the entire colon.  The exam was otherwise without abnormality on                            direct and retroflexion views. Complications:            No immediate complications. Estimated blood loss:                            Minimal. Estimated Blood Loss:     Estimated blood loss was minimal. Impression:               - One 3 mm polyp in the cecum, removed with a cold                            snare. Resected and retrieved.                           - Diverticulosis in the entire examined colon.                           - The examination was otherwise normal on direct                            and retroflexion views. Recommendation:           - Patient has a contact number available for                            emergencies. The signs and symptoms of potential                            delayed complications were discussed with the                            patient. Return to normal activities tomorrow.                             Written discharge instructions were provided to the                            patient.                           - Resume previous diet.                           - Continue present medications.                           - No ibuprofen, naproxen, or other non-steroidal                            anti-inflammatory drugs for 2 weeks after polyp                            removal.                           -  Await pathology results.                           - Repeat colonoscopy is recommended for                            surveillance. The colonoscopy date will be                            determined after pathology results from today's                            exam become available for review. Remo Lipps P. Syrus Nakama, MD 11/09/2015 9:07:31 AM This report has been signed electronically.

## 2015-11-10 ENCOUNTER — Telehealth: Payer: Self-pay | Admitting: *Deleted

## 2015-11-10 NOTE — Telephone Encounter (Signed)
  Follow up Call-  Call back number 11/09/2015  Post procedure Call Back phone  # [phone]  Permission to leave phone message Yes     Patient questions:  Do you have a fever, pain , or abdominal swelling? No. Pain Score  0 *  Have you tolerated food without any problems? Yes.    Have you been able to return to your normal activities? Yes.    Do you have any questions about your discharge instructions: Diet   No. Medications  No. Follow up visit  No.  Do you have questions or concerns about your Care? No.  Actions: * If pain score is 4 or above: No action needed, pain <4.

## 2015-11-20 ENCOUNTER — Encounter: Payer: Self-pay | Admitting: Gastroenterology

## 2016-02-28 ENCOUNTER — Ambulatory Visit: Payer: BLUE CROSS/BLUE SHIELD | Admitting: Family Medicine

## 2016-02-29 ENCOUNTER — Ambulatory Visit (INDEPENDENT_AMBULATORY_CARE_PROVIDER_SITE_OTHER): Payer: BLUE CROSS/BLUE SHIELD | Admitting: Family Medicine

## 2016-02-29 ENCOUNTER — Encounter: Payer: Self-pay | Admitting: Family Medicine

## 2016-02-29 DIAGNOSIS — E785 Hyperlipidemia, unspecified: Secondary | ICD-10-CM | POA: Diagnosis not present

## 2016-02-29 DIAGNOSIS — N529 Male erectile dysfunction, unspecified: Secondary | ICD-10-CM | POA: Diagnosis not present

## 2016-02-29 LAB — LIPID PANEL
CHOLESTEROL: 174 mg/dL (ref 125–200)
HDL: 33 mg/dL — ABNORMAL LOW (ref >=40)
LDL Cholesterol: 92 mg/dL (ref <130)
TRIGLYCERIDES: 243 mg/dL — AB (ref <150)
Total CHOL/HDL Ratio: 5.3 Ratio — ABNORMAL HIGH (ref <=5.0)
VLDL: 49 mg/dL — ABNORMAL HIGH (ref <30)

## 2016-02-29 MED ORDER — PRAVASTATIN SODIUM 20 MG PO TABS: 20.0000 mg | ORAL_TABLET | Freq: Every day | ORAL | 2 refills | Status: DC

## 2016-02-29 MED ORDER — OMEGA-3-ACID ETHYL ESTERS 1 G PO CAPS: 2.0000 g | ORAL_CAPSULE | Freq: Two times a day (BID) | ORAL | 2 refills | Status: DC

## 2016-02-29 MED ORDER — TADALAFIL 5 MG PO TABS: 2.5000 mg | ORAL_TABLET | Freq: Every day | ORAL | 1 refills | Status: DC | PRN

## 2016-02-29 NOTE — Patient Instructions (Addendum)
No change in medications for now. I will let you know once the labs are back. Follow-up in about 6 months for a physical. Stretching after exercise as well as before may be beneficial, cardio exercise at least a few times per week. Let me know if you have any questions in the meantime.    Food Choices to Lower Your Triglycerides Triglycerides are a type of fat in your blood. High levels of triglycerides can increase the risk of heart disease and stroke. If your triglyceride levels are high, the foods you eat and your eating habits are very important. Choosing the right foods can help lower your triglycerides.  WHAT GENERAL GUIDELINES DO I NEED TO FOLLOW?  Lose weight if you are overweight.   Limit or avoid alcohol.   Fill one half of your plate with vegetables and green salads.   Limit fruit to two servings a day. Choose fruit instead of juice.   Make one fourth of your plate whole grains. Look for the word "whole" as the first word in the ingredient list.  Fill one fourth of your plate with lean protein foods.  Enjoy fatty fish (such as salmon, mackerel, sardines, and tuna) three times a week.   Choose healthy fats.   Limit foods high in starch and sugar.  Eat more home-cooked food and less restaurant, buffet, and fast food.  Limit fried foods.  Cook foods using methods other than frying.  Limit saturated fats.  Check ingredient lists to avoid foods with partially hydrogenated oils (trans fats) in them. WHAT FOODS CAN I EAT?  Grains Whole grains, such as whole wheat or whole grain breads, crackers, cereals, and pasta. Unsweetened oatmeal, bulgur, barley, quinoa, or brown rice. Corn or whole wheat flour tortillas.  Vegetables Fresh or frozen vegetables (raw, steamed, roasted, or grilled). Green salads. Fruits All fresh, canned (in natural juice), or frozen fruits. Meat and Other Protein Products Ground beef (85% or leaner), grass-fed beef, or beef trimmed of fat.  Skinless chicken or [place]. Ground chicken or [place]. Pork trimmed of fat. All fish and seafood. Eggs. Dried beans, peas, or lentils. Unsalted nuts or seeds. Unsalted canned or dry beans. Dairy Low-fat dairy products, such as skim or 1% milk, 2% or reduced-fat cheeses, low-fat ricotta or cottage cheese, or plain low-fat yogurt. Fats and Oils Tub margarines without trans fats. Light or reduced-fat mayonnaise and salad dressings. Avocado. Safflower, olive, or canola oils. Natural peanut or almond butter. The items listed above may not be a complete list of recommended foods or beverages. Contact your dietitian for more options. WHAT FOODS ARE NOT RECOMMENDED?  Grains White bread. White pasta. White rice. Cornbread. Bagels, pastries, and croissants. Crackers that contain trans fat. Vegetables White potatoes. Corn. Creamed or fried vegetables. Vegetables in a cheese sauce. Fruits Dried fruits. Canned fruit in light or heavy syrup. Fruit juice. Meat and Other Protein Products Fatty cuts of meat. Ribs, chicken wings, bacon, sausage, bologna, salami, chitterlings, fatback, hot dogs, bratwurst, and packaged luncheon meats. Dairy Whole or 2% milk, cream, half-and-half, and cream cheese. Whole-fat or sweetened yogurt. Full-fat cheeses. Nondairy creamers and whipped toppings. Processed cheese, cheese spreads, or cheese curds. Sweets and Desserts Corn syrup, sugars, honey, and molasses. Candy. Jam and jelly. Syrup. Sweetened cereals. Cookies, pies, cakes, donuts, muffins, and ice cream. Fats and Oils Butter, stick margarine, lard, shortening, ghee, or bacon fat. Coconut, palm kernel, or palm oils. Beverages Alcohol. Sweetened drinks (such as sodas, lemonade, and fruit drinks or punches). The  items listed above may not be a complete list of foods and beverages to avoid. Contact your dietitian for more information.   This information is not intended to replace advice given to you by your health care  provider. Make sure you discuss any questions you have with your health care provider.   Document Released: 03/07/2004 Document Revised: 06/10/2014 Document Reviewed: 03/24/2013 Elsevier Interactive Patient Education 2016 [employer].   IF you received an x-ray today, you will receive an invoice from [facility]. Please contact [facility] at [phone] with questions or concerns regarding your invoice.   IF you received labwork today, you will receive an invoice from [employer]. Please contact [facility] at [phone] with questions or concerns regarding your invoice.   Our billing staff will not be able to assist you with questions regarding bills from these companies.  You will be contacted with the lab results as soon as they are available. The fastest way to get your results is to activate your My Chart account. Instructions are located on the last page of this paperwork. If you have not heard from [place] regarding the results in 2 weeks, please contact this office.

## 2016-02-29 NOTE — Progress Notes (Signed)
FP

## 2016-02-29 NOTE — Progress Notes (Signed)
By signing my name below I, Tereasa Coop, attest that this documentation has been prepared under the direction and in the presence of Wendie Agreste, MD. Electonically Signed. Tereasa Coop, Scribe 02/29/2016 at 8:16 AM  Subjective:    Patient ID: Paul Rich, male    DOB: 02-18-1961, 55 y.o.   MRN: [id number]  Chief Complaint  Patient presents with  . Follow-up    6 MONTH  . Medication Refill    PRAVASTATIN, CIALIS, and LOVAZA all for 90 days    HPI Paul Rich is a 55 y.o. male who presents to the [facility] for 6 month follow up reevaluation of HLD. Takes pravastatin and lovaza.  Lab Results  Component Value Date   CHOL 192 08/23/2015   HDL 34 (L) 08/23/2015   LDLCALC 89 08/23/2015   TRIG 347 (H) 08/23/2015   CHOLHDL 5.6 (H) 08/23/2015   Lab Results  Component Value Date   ALT 37 03/17/2012   AST 30 03/17/2012   ALKPHOS 47 03/17/2012   BILITOT 1.3 (H) 03/17/2012   triglycerides significantly elevated last visit. Recommended returning for fasting labs. Have not been drawn yet.   Hypogonadism: Pt is on cialis and testosterone injections. Pt previously used cialis 3 times per week. Followed by [facility] who monitors prostate and testosterone levels. Pt confirms that he still is using cialis 3 times per week. Pt denies having any CP, SOB, back pain, N/V, changes in vision, blue vision, or changes in his hearing.   Pt has not had any food today and is ready for fasting lab work.    Pt states he has not been getting as much cardio work outs this year due to his work schedule. Pt has been lifting weights multiple times a week.   Pt has been eating less sugar.  Pt is getting his flu shot next week at work.    Patient Active Problem List   Diagnosis Date Noted  . Allergic rhinitis due to other allergen 05/24/2011  . Allergy to shellfish 05/24/2011  . OSA (obstructive sleep apnea) 05/21/2011  . HEMORRHOIDS 10/12/2008  . COLONIC  POLYPS 08/29/2008  . HYPERLIPIDEMIA 08/29/2008  . RECTAL BLEEDING 08/29/2008   Past Medical History:  Diagnosis Date  . Allergy    pollen  . AV block   . Childhood asthma   . Environmental allergies   . Hemorrhage of rectum and anus   . Other and unspecified hyperlipidemia    Past Surgical History:  Procedure Laterality Date  . HERNIA REPAIR  2012   inguinal and umbilical   Allergies  Allergen Reactions  . Shellfish Allergy Anaphylaxis and Swelling    Of swelling of eyes  . Mold Extract [Trichophyton]    Prior to Admission medications   Medication Sig Start Date End Date Taking? Authorizing Provider  aspirin 81 MG tablet Take 81 mg by mouth daily.     Yes Historical Provider, MD  Multiple Vitamin (MULTIVITAMIN) tablet Take 1 tablet by mouth daily.     Yes Historical Provider, MD  niacinamide 500 MG tablet Take 500 mg by mouth 2 (two) times daily with a meal.   Yes Historical Provider, MD  omega-3 acid ethyl esters (LOVAZA) 1 g capsule Take 2 capsules (2 g total) by mouth 2 (two) times daily. 08/23/15  Yes Wendie Agreste, MD  pravastatin (PRAVACHOL) 20 MG tablet Take 1 tablet (20 mg total) by mouth daily. 08/23/15  Yes Wendie Agreste, MD  PRESCRIPTION MEDICATION HCG injectable taking 2 ml   Yes Historical Provider, MD  tadalafil (CIALIS) 5 MG tablet Take 0.5-1 tablets (2.5-5 mg total) by mouth daily as needed for erectile dysfunction. 08/23/15  Yes Wendie Agreste, MD  testosterone cypionate (DEPOTESTOSTERONE CYPIONATE) 200 MG/ML injection Inject into the muscle every 7 (seven) days.    Yes Historical Provider, MD   Social History   Social History  . Marital status: Married    Spouse name: N/A  . Number of children: N/A  . Years of education: N/A   Occupational History  . HR exec-business    Social History Main Topics  . Smoking status: Never Smoker  . Smokeless tobacco: Never Used  . Alcohol use 1.8 - 2.4 oz/week    3 - 4 Shots of liquor per week     Comment: occ  red wine and beer but usually burbon  . Drug use: No  . Sexual activity: Yes   Other Topics Concern  . Not on file   Social History Narrative   Divorced   Exercise: Tes, 4 times a week   Education: PHD      Review of Systems  Constitutional: Negative for fatigue and unexpected weight change.  Eyes: Negative for visual disturbance.  Respiratory: Negative for cough, chest tightness and shortness of breath.   Cardiovascular: Negative for chest pain, palpitations and leg swelling.  Gastrointestinal: Negative for abdominal pain and blood in stool.  Musculoskeletal: Negative for back pain.  Neurological: Negative for dizziness, light-headedness and headaches.       Objective:   Physical Exam  Constitutional: He is oriented to person, place, and time. He appears well-developed and well-nourished.  HENT:  Head: Normocephalic and atraumatic.  Eyes: EOM are normal. Pupils are equal, round, and reactive to light.  Neck: No JVD present. Carotid bruit is not present.  Cardiovascular: Normal rate, regular rhythm and normal heart sounds.   No murmur heard. Pulmonary/Chest: Effort normal and breath sounds normal. He has no rales.  Musculoskeletal: He exhibits no edema.  Neurological: He is alert and oriented to person, place, and time.  Skin: Skin is warm and dry.  Psychiatric: He has a normal mood and affect.  Vitals reviewed.    Vitals:   02/29/16 0755  BP: 118/90  Pulse: 64  Resp: 16  Temp: 97.7 F (36.5 C)  TempSrc: Oral  SpO2: 97%  Weight: 218 lb 6.4 oz (99.1 kg)  Height: 6' 1.75" (1.873 m)         Assessment & Plan:   Paul Rich is a 55 y.o. male Hyperlipidemia - Plan: omega-3 acid ethyl esters (LOVAZA) 1 g capsule, pravastatin (PRAVACHOL) 20 MG tablet, Lipid panel  - check lipids, no change in meds for now as tolerating current regimen.   - continue exercise.  Has routine follow up with [facility], labwork followed there routinely for  testosterone supplementation. Discussed stretching after exercise as well as pre-exercise warmup.   Erectile dysfunction, unspecified erectile dysfunction type - Plan: tadalafil (CIALIS) 5 MG tablet  - stable. No new side effects. Continue Cialis prn.   Meds ordered this encounter  Medications  . omega-3 acid ethyl esters (LOVAZA) 1 g capsule    Sig: Take 2 capsules (2 g total) by mouth 2 (two) times daily.    Dispense:  360 capsule    Refill:  2  . pravastatin (PRAVACHOL) 20 MG tablet    Sig: Take 1 tablet (20 mg total) by mouth daily.  Dispense:  90 tablet    Refill:  2  . tadalafil (CIALIS) 5 MG tablet    Sig: Take 0.5-1 tablets (2.5-5 mg total) by mouth daily as needed for erectile dysfunction.    Dispense:  90 tablet    Refill:  1   Patient Instructions   No change in medications for now. I will let you know once the labs are back. Follow-up in about 6 months for a physical. Stretching after exercise as well as before may be beneficial, cardio exercise at least a few times per week. Let me know if you have any questions in the meantime.    Food Choices to Lower Your Triglycerides Triglycerides are a type of fat in your blood. High levels of triglycerides can increase the risk of heart disease and stroke. If your triglyceride levels are high, the foods you eat and your eating habits are very important. Choosing the right foods can help lower your triglycerides.  WHAT GENERAL GUIDELINES DO I NEED TO FOLLOW?  Lose weight if you are overweight.   Limit or avoid alcohol.   Fill one half of your plate with vegetables and green salads.   Limit fruit to two servings a day. Choose fruit instead of juice.   Make one fourth of your plate whole grains. Look for the word "whole" as the first word in the ingredient list.  Fill one fourth of your plate with lean protein foods.  Enjoy fatty fish (such as salmon, mackerel, sardines, and tuna) three times a week.   Choose healthy  fats.   Limit foods high in starch and sugar.  Eat more home-cooked food and less restaurant, buffet, and fast food.  Limit fried foods.  Cook foods using methods other than frying.  Limit saturated fats.  Check ingredient lists to avoid foods with partially hydrogenated oils (trans fats) in them. WHAT FOODS CAN I EAT?  Grains Whole grains, such as whole wheat or whole grain breads, crackers, cereals, and pasta. Unsweetened oatmeal, bulgur, barley, quinoa, or brown rice. Corn or whole wheat flour tortillas.  Vegetables Fresh or frozen vegetables (raw, steamed, roasted, or grilled). Green salads. Fruits All fresh, canned (in natural juice), or frozen fruits. Meat and Other Protein Products Ground beef (85% or leaner), grass-fed beef, or beef trimmed of fat. Skinless chicken or [place]. Ground chicken or [place]. Pork trimmed of fat. All fish and seafood. Eggs. Dried beans, peas, or lentils. Unsalted nuts or seeds. Unsalted canned or dry beans. Dairy Low-fat dairy products, such as skim or 1% milk, 2% or reduced-fat cheeses, low-fat ricotta or cottage cheese, or plain low-fat yogurt. Fats and Oils Tub margarines without trans fats. Light or reduced-fat mayonnaise and salad dressings. Avocado. Safflower, olive, or canola oils. Natural peanut or almond butter. The items listed above may not be a complete list of recommended foods or beverages. Contact your dietitian for more options. WHAT FOODS ARE NOT RECOMMENDED?  Grains White bread. White pasta. White rice. Cornbread. Bagels, pastries, and croissants. Crackers that contain trans fat. Vegetables White potatoes. Corn. Creamed or fried vegetables. Vegetables in a cheese sauce. Fruits Dried fruits. Canned fruit in light or heavy syrup. Fruit juice. Meat and Other Protein Products Fatty cuts of meat. Ribs, chicken wings, bacon, sausage, bologna, salami, chitterlings, fatback, hot dogs, bratwurst, and packaged luncheon meats. Dairy Whole  or 2% milk, cream, half-and-half, and cream cheese. Whole-fat or sweetened yogurt. Full-fat cheeses. Nondairy creamers and whipped toppings. Processed cheese, cheese spreads, or cheese curds. Sweets  and Desserts Corn syrup, sugars, honey, and molasses. Candy. Jam and jelly. Syrup. Sweetened cereals. Cookies, pies, cakes, donuts, muffins, and ice cream. Fats and Oils Butter, stick margarine, lard, shortening, ghee, or bacon fat. Coconut, palm kernel, or palm oils. Beverages Alcohol. Sweetened drinks (such as sodas, lemonade, and fruit drinks or punches). The items listed above may not be a complete list of foods and beverages to avoid. Contact your dietitian for more information.   This information is not intended to replace advice given to you by your health care provider. Make sure you discuss any questions you have with your health care provider.   Document Released: 03/07/2004 Document Revised: 06/10/2014 Document Reviewed: 03/24/2013 Elsevier Interactive Patient Education 2016 [employer].   IF you received an x-ray today, you will receive an invoice from [facility]. Please contact [facility] at [phone] with questions or concerns regarding your invoice.   IF you received labwork today, you will receive an invoice from [employer]. Please contact [facility] at [phone] with questions or concerns regarding your invoice.   Our billing staff will not be able to assist you with questions regarding bills from these companies.  You will be contacted with the lab results as soon as they are available. The fastest way to get your results is to activate your My Chart account. Instructions are located on the last page of this paperwork. If you have not heard from [place] regarding the results in 2 weeks, please contact this office.       I personally performed the services described in this documentation, which was scribed in my presence. The  recorded information has been reviewed and considered, and addended by me as needed.   Signed,   Merri Ray, MD [facility].  02/29/16 10:17 PM

## 2016-07-11 ENCOUNTER — Telehealth: Payer: Self-pay

## 2016-07-11 DIAGNOSIS — N529 Male erectile dysfunction, unspecified: Secondary | ICD-10-CM

## 2016-07-11 DIAGNOSIS — E785 Hyperlipidemia, unspecified: Secondary | ICD-10-CM

## 2016-07-11 NOTE — Telephone Encounter (Signed)
PATIENT WOULD LIKE DR. GREENE TO KNOW THAT HE NEEDS TO GET HIS MEDICATIONS SENT TO HIS NEW PHARMACY. HIS EMPLOYER CHANGED FROM [employer] TO [facility]. HE NEEDS TO GET TRANSFERRED PRAVASTATIN 20 MG, CIALIS 5 MG AND OMEGA 3 LOVAZA. THEY NEED TO BE A 3 MONTH DOSAGE. BEST PHONE [phone] (WORK) OR [phone] (CELL) MBC

## 2016-07-12 MED ORDER — PRAVASTATIN SODIUM 20 MG PO TABS: 20.0000 mg | ORAL_TABLET | Freq: Every day | ORAL | 1 refills | Status: DC

## 2016-07-12 MED ORDER — TADALAFIL 5 MG PO TABS: 2.5000 mg | ORAL_TABLET | Freq: Every day | ORAL | 1 refills | Status: DC | PRN

## 2016-07-12 MED ORDER — OMEGA-3-ACID ETHYL ESTERS 1 G PO CAPS: 2.0000 g | ORAL_CAPSULE | Freq: Two times a day (BID) | ORAL | 1 refills | Status: DC

## 2019-03-25 DIAGNOSIS — F4322 Adjustment disorder with anxiety: Secondary | ICD-10-CM | POA: Diagnosis not present

## 2019-04-03 DIAGNOSIS — F4323 Adjustment disorder with mixed anxiety and depressed mood: Secondary | ICD-10-CM | POA: Diagnosis not present

## 2019-04-10 DIAGNOSIS — F4323 Adjustment disorder with mixed anxiety and depressed mood: Secondary | ICD-10-CM | POA: Diagnosis not present

## 2019-04-17 DIAGNOSIS — F4323 Adjustment disorder with mixed anxiety and depressed mood: Secondary | ICD-10-CM | POA: Diagnosis not present

## 2019-04-24 DIAGNOSIS — F4323 Adjustment disorder with mixed anxiety and depressed mood: Secondary | ICD-10-CM | POA: Diagnosis not present

## 2019-05-08 DIAGNOSIS — F4323 Adjustment disorder with mixed anxiety and depressed mood: Secondary | ICD-10-CM | POA: Diagnosis not present

## 2019-05-15 DIAGNOSIS — F4323 Adjustment disorder with mixed anxiety and depressed mood: Secondary | ICD-10-CM | POA: Diagnosis not present

## 2019-05-22 DIAGNOSIS — F4323 Adjustment disorder with mixed anxiety and depressed mood: Secondary | ICD-10-CM | POA: Diagnosis not present

## 2019-05-26 DIAGNOSIS — F4323 Adjustment disorder with mixed anxiety and depressed mood: Secondary | ICD-10-CM | POA: Diagnosis not present

## 2019-06-01 DIAGNOSIS — F4323 Adjustment disorder with mixed anxiety and depressed mood: Secondary | ICD-10-CM | POA: Diagnosis not present

## 2019-06-12 DIAGNOSIS — F4323 Adjustment disorder with mixed anxiety and depressed mood: Secondary | ICD-10-CM | POA: Diagnosis not present

## 2019-06-19 DIAGNOSIS — F4323 Adjustment disorder with mixed anxiety and depressed mood: Secondary | ICD-10-CM | POA: Diagnosis not present

## 2019-06-30 DIAGNOSIS — F4323 Adjustment disorder with mixed anxiety and depressed mood: Secondary | ICD-10-CM | POA: Diagnosis not present

## 2019-07-24 DIAGNOSIS — F4323 Adjustment disorder with mixed anxiety and depressed mood: Secondary | ICD-10-CM | POA: Diagnosis not present

## 2019-07-31 DIAGNOSIS — F4323 Adjustment disorder with mixed anxiety and depressed mood: Secondary | ICD-10-CM | POA: Diagnosis not present

## 2019-08-02 DIAGNOSIS — K56609 Unspecified intestinal obstruction, unspecified as to partial versus complete obstruction: Secondary | ICD-10-CM

## 2019-08-02 HISTORY — DX: Unspecified intestinal obstruction, unspecified as to partial versus complete obstruction: K56.609

## 2019-08-04 ENCOUNTER — Inpatient Hospital Stay (HOSPITAL_COMMUNITY): Payer: BC Managed Care – PPO

## 2019-08-04 ENCOUNTER — Inpatient Hospital Stay (HOSPITAL_COMMUNITY)
Admission: EM | Admit: 2019-08-04 | Discharge: 2019-08-06 | DRG: 389 | Disposition: A | Discharge status: Home / Self Care | Payer: BC Managed Care – PPO | Attending: Internal Medicine | Admitting: Internal Medicine

## 2019-08-04 ENCOUNTER — Encounter (HOSPITAL_COMMUNITY): Payer: Self-pay | Admitting: Emergency Medicine

## 2019-08-04 ENCOUNTER — Emergency Department (HOSPITAL_COMMUNITY): Payer: BC Managed Care – PPO

## 2019-08-04 ENCOUNTER — Other Ambulatory Visit: Payer: Self-pay

## 2019-08-04 DIAGNOSIS — Z20822 Contact with and (suspected) exposure to covid-19: Secondary | ICD-10-CM | POA: Diagnosis present

## 2019-08-04 DIAGNOSIS — E860 Dehydration: Secondary | ICD-10-CM | POA: Diagnosis not present

## 2019-08-04 DIAGNOSIS — Z79890 Hormone replacement therapy (postmenopausal): Secondary | ICD-10-CM

## 2019-08-04 DIAGNOSIS — Z7982 Long term (current) use of aspirin: Secondary | ICD-10-CM

## 2019-08-04 DIAGNOSIS — Z801 Family history of malignant neoplasm of trachea, bronchus and lung: Secondary | ICD-10-CM | POA: Diagnosis not present

## 2019-08-04 DIAGNOSIS — Z79899 Other long term (current) drug therapy: Secondary | ICD-10-CM | POA: Diagnosis not present

## 2019-08-04 DIAGNOSIS — N179 Acute kidney failure, unspecified: Secondary | ICD-10-CM | POA: Diagnosis present

## 2019-08-04 DIAGNOSIS — Z0189 Encounter for other specified special examinations: Secondary | ICD-10-CM

## 2019-08-04 DIAGNOSIS — E291 Testicular hypofunction: Secondary | ICD-10-CM | POA: Diagnosis not present

## 2019-08-04 DIAGNOSIS — Z833 Family history of diabetes mellitus: Secondary | ICD-10-CM | POA: Diagnosis not present

## 2019-08-04 DIAGNOSIS — K56609 Unspecified intestinal obstruction, unspecified as to partial versus complete obstruction: Secondary | ICD-10-CM | POA: Diagnosis not present

## 2019-08-04 DIAGNOSIS — Z91013 Allergy to seafood: Secondary | ICD-10-CM

## 2019-08-04 DIAGNOSIS — Z8349 Family history of other endocrine, nutritional and metabolic diseases: Secondary | ICD-10-CM

## 2019-08-04 DIAGNOSIS — E785 Hyperlipidemia, unspecified: Secondary | ICD-10-CM | POA: Diagnosis present

## 2019-08-04 DIAGNOSIS — R109 Unspecified abdominal pain: Secondary | ICD-10-CM | POA: Diagnosis not present

## 2019-08-04 DIAGNOSIS — K56600 Partial intestinal obstruction, unspecified as to cause: Secondary | ICD-10-CM

## 2019-08-04 DIAGNOSIS — Z03818 Encounter for observation for suspected exposure to other biological agents ruled out: Secondary | ICD-10-CM | POA: Diagnosis not present

## 2019-08-04 DIAGNOSIS — Z8249 Family history of ischemic heart disease and other diseases of the circulatory system: Secondary | ICD-10-CM

## 2019-08-04 DIAGNOSIS — Z4682 Encounter for fitting and adjustment of non-vascular catheter: Secondary | ICD-10-CM | POA: Diagnosis not present

## 2019-08-04 HISTORY — DX: Testicular hypofunction: E29.1

## 2019-08-04 LAB — COMPREHENSIVE METABOLIC PANEL
ALT: 38 U/L (ref 0–44)
AST: 32 U/L (ref 15–41)
Albumin: 4.4 g/dL (ref 3.5–5.0)
Alkaline Phosphatase: 50 U/L (ref 38–126)
Anion gap: 10 (ref 5–15)
BUN: 16 mg/dL (ref 6–20)
CO2: 29 mmol/L (ref 22–32)
Calcium: 10.6 mg/dL — ABNORMAL HIGH (ref 8.9–10.3)
Chloride: 98 mmol/L (ref 98–111)
Creatinine, Ser: 1.28 mg/dL — ABNORMAL HIGH (ref 0.61–1.24)
GFR calc Af Amer: >60 mL/min (ref >60)
GFR calc non Af Amer: >60 mL/min (ref >60)
Glucose, Bld: 167 mg/dL — ABNORMAL HIGH (ref 70–99)
Potassium: 4.1 mmol/L (ref 3.5–5.1)
Sodium: 137 mmol/L (ref 135–145)
Total Bilirubin: 1 mg/dL (ref 0.3–1.2)
Total Protein: 7.8 g/dL (ref 6.5–8.1)

## 2019-08-04 LAB — URINALYSIS, ROUTINE W REFLEX MICROSCOPIC
Bilirubin Urine: NEGATIVE
Glucose, UA: NEGATIVE mg/dL
Hgb urine dipstick: NEGATIVE
Ketones, ur: NEGATIVE mg/dL
Leukocytes,Ua: NEGATIVE
Nitrite: NEGATIVE
Protein, ur: 100 mg/dL — AB
Specific Gravity, Urine: 1.023 (ref 1.005–1.030)
pH: 8 (ref 5.0–8.0)

## 2019-08-04 LAB — CBC
HCT: 57.9 % — ABNORMAL HIGH (ref 39.0–52.0)
Hemoglobin: 18 g/dL — ABNORMAL HIGH (ref 13.0–17.0)
MCH: 25.1 pg — ABNORMAL LOW (ref 26.0–34.0)
MCHC: 31.1 g/dL (ref 30.0–36.0)
MCV: 80.8 fL (ref 80.0–100.0)
Platelets: 226 10*3/uL (ref 150–400)
RBC: 7.17 MIL/uL — ABNORMAL HIGH (ref 4.22–5.81)
RDW: 16.9 % — ABNORMAL HIGH (ref 11.5–15.5)
WBC: 13 10*3/uL — ABNORMAL HIGH (ref 4.0–10.5)
nRBC: 0 % (ref 0.0–0.2)

## 2019-08-04 LAB — SARS CORONAVIRUS 2 (TAT 6-24 HRS): SARS Coronavirus 2: NEGATIVE

## 2019-08-04 LAB — HIV ANTIBODY (ROUTINE TESTING W REFLEX): HIV Screen 4th Generation wRfx: NONREACTIVE

## 2019-08-04 LAB — POC SARS CORONAVIRUS 2 AG -  ED: SARS Coronavirus 2 Ag: NEGATIVE

## 2019-08-04 LAB — LIPASE, BLOOD: Lipase: 30 U/L (ref 11–51)

## 2019-08-04 MED ORDER — ALUM & MAG HYDROXIDE-SIMETH 200-200-20 MG/5ML PO SUSP
30.0000 mL | Freq: Once | ORAL | Status: AC
  Administered 2019-08-04: 30 mL via ORAL
  Filled 2019-08-04: qty 30

## 2019-08-04 MED ORDER — ONDANSETRON HCL 4 MG/2ML IJ SOLN
4.0000 mg | Freq: Once | INTRAMUSCULAR | Status: AC | PRN
  Administered 2019-08-04: 4 mg via INTRAVENOUS
  Filled 2019-08-04: qty 2

## 2019-08-04 MED ORDER — MORPHINE SULFATE (PF) 2 MG/ML IV SOLN: 2.0000 mg | INTRAVENOUS | Status: DC | PRN

## 2019-08-04 MED ORDER — PANTOPRAZOLE SODIUM 40 MG IV SOLR
40.0000 mg | Freq: Once | INTRAVENOUS | Status: AC
  Administered 2019-08-04: 40 mg via INTRAVENOUS
  Filled 2019-08-04: qty 40

## 2019-08-04 MED ORDER — FENTANYL CITRATE (PF) 100 MCG/2ML IJ SOLN
50.0000 ug | Freq: Once | INTRAMUSCULAR | Status: AC | PRN
  Administered 2019-08-04: 50 ug via INTRAVENOUS
  Filled 2019-08-04: qty 2

## 2019-08-04 MED ORDER — ACETAMINOPHEN 650 MG RE SUPP: 650.0000 mg | Freq: Four times a day (QID) | RECTAL | Status: DC | PRN

## 2019-08-04 MED ORDER — ENOXAPARIN SODIUM 40 MG/0.4ML ~~LOC~~ SOLN
40.0000 mg | SUBCUTANEOUS | Status: DC
  Administered 2019-08-04 – 2019-08-06 (×3): 40 mg via SUBCUTANEOUS
  Filled 2019-08-04 (×3): qty 0.4

## 2019-08-04 MED ORDER — FENTANYL CITRATE (PF) 100 MCG/2ML IJ SOLN
50.0000 ug | Freq: Once | INTRAMUSCULAR | Status: AC
  Administered 2019-08-04: 50 ug via INTRAVENOUS
  Filled 2019-08-04: qty 2

## 2019-08-04 MED ORDER — DIPHENHYDRAMINE HCL 50 MG/ML IJ SOLN: 12.5000 mg | Freq: Every evening | INTRAMUSCULAR | Status: DC | PRN

## 2019-08-04 MED ORDER — LACTATED RINGERS IV SOLN: INTRAVENOUS | Status: DC

## 2019-08-04 MED ORDER — ONDANSETRON HCL 4 MG/2ML IJ SOLN: 4.0000 mg | Freq: Four times a day (QID) | INTRAMUSCULAR | Status: DC | PRN

## 2019-08-04 MED ORDER — MELATONIN 3 MG PO TABS
3.0000 mg | ORAL_TABLET | Freq: Every day | ORAL | Status: DC
  Administered 2019-08-04 – 2019-08-05 (×2): 3 mg via ORAL
  Filled 2019-08-04 (×2): qty 1

## 2019-08-04 MED ORDER — ONDANSETRON HCL 4 MG PO TABS: 4.0000 mg | ORAL_TABLET | Freq: Four times a day (QID) | ORAL | Status: DC | PRN

## 2019-08-04 MED ORDER — NON FORMULARY: 3.0000 mg | Freq: Every evening | Status: DC | PRN

## 2019-08-04 MED ORDER — FAMOTIDINE IN NACL 20-0.9 MG/50ML-% IV SOLN
20.0000 mg | Freq: Once | INTRAVENOUS | Status: AC
  Administered 2019-08-04: 20 mg via INTRAVENOUS
  Filled 2019-08-04: qty 50

## 2019-08-04 MED ORDER — IOHEXOL 300 MG/ML  SOLN
100.0000 mL | Freq: Once | INTRAMUSCULAR | Status: AC | PRN
  Administered 2019-08-04: 100 mL via INTRAVENOUS

## 2019-08-04 MED ORDER — ACETAMINOPHEN 325 MG PO TABS
650.0000 mg | ORAL_TABLET | Freq: Four times a day (QID) | ORAL | Status: DC | PRN
  Administered 2019-08-04: 650 mg via ORAL
  Filled 2019-08-04: qty 2

## 2019-08-04 MED ORDER — LACTATED RINGERS IV BOLUS
1000.0000 mL | Freq: Once | INTRAVENOUS | Status: AC
  Administered 2019-08-04: 1000 mL via INTRAVENOUS

## 2019-08-04 MED ORDER — SODIUM CHLORIDE 0.9% FLUSH: 3.0000 mL | Freq: Once | INTRAVENOUS | Status: DC

## 2019-08-04 NOTE — Consult Note (Signed)
[facility] Surgery Consult Note  Paul Rich 1960/08/17  [id number].    Requesting MD: Karmen Bongo Chief Complaint/Reason for Consult: SBO  HPI:  Paul Rich is a 59yo male [place] HLD with prior h/o left inguinal hernia repair x2 and umbilical hernia repair with mesh 2012 by Dr. Marlou Starks, who was admitted to [facility] earlier this morning with SBO. He had never had an SBO before. States that he started experiencing abdominal distension and crampy, intermittent abdominal pain around 1400 yesterday. Pain was mostly in his central abdomen. He did vomit in the emergency room. NG tube was placed and has put out 600cc since being on the floor. CT scan showed moderately dilated stomach and small bowel to the level of the proximal ileum where there is a focal area of narrowing, which is likely due to a ileus versus partial small bowel obstruction. WBC 13, Hgb 18, Cr 1.28. General surgery asked to see.  Patient states that he is feeling much better this morning. He is passing flatus and had a bowel movement this morning.  Anticoagulants: none Nonsmoker Employment: HR for [employer]  Review of Systems  Constitutional: Negative.   HENT: Negative.   Eyes: Negative.   Respiratory: Negative.   Cardiovascular: Negative.   Gastrointestinal: Positive for abdominal pain, nausea and vomiting.  Genitourinary: Negative.   Musculoskeletal: Negative.   Skin: Negative.   Neurological: Negative.     All systems reviewed and otherwise negative except for as above  Family History  Problem Relation Age of Onset  . Diabetes Mother   . Heart disease Mother   . Hyperlipidemia Mother   . Hypertension Mother   . Kidney disease Paternal Grandmother   . Lung cancer Paternal Grandmother   . Lung cancer Paternal Grandfather   . Heart disease Paternal Grandfather   . Heart disease Maternal Grandmother   . Hyperlipidemia Maternal Grandmother   . Heart disease Maternal Grandfather   . Colon cancer Neg Hx      Past Medical History:  Diagnosis Date  . Allergy    pollen  . AV block   . Childhood asthma   . Environmental allergies   . Hemorrhage of rectum and anus   . Other and unspecified hyperlipidemia   . SBO (small bowel obstruction) ([place]) 08/2019    Past Surgical History:  Procedure Laterality Date  . HERNIA REPAIR  2012   inguinal and umbilical    Social History:  reports that he has never smoked. He has never used smokeless tobacco. He reports current alcohol use of about 3.0 - 4.0 standard drinks of alcohol per week. He reports that he does not use drugs.  Allergies:  Allergies  Allergen Reactions  . Shellfish Allergy Anaphylaxis and Swelling    Of swelling of eyes  . Mold Extract [Trichophyton]     Medications Prior to Admission  Medication Sig Dispense Refill  . aspirin 81 MG tablet Take 81 mg by mouth daily.      . Multiple Vitamin (MULTIVITAMIN) tablet Take 1 tablet by mouth daily.      . niacinamide 500 MG tablet Take 500 mg by mouth 2 (two) times daily with a meal.    . omega-3 acid ethyl esters (LOVAZA) 1 g capsule Take 2 capsules (2 g total) by mouth 2 (two) times daily. 360 capsule 1  . OVER THE COUNTER MEDICATION Take 1 capsule by mouth daily. Nitric Oxide    . OVER THE COUNTER MEDICATION Take 1 capsule by mouth daily. Vitamin A &  D Combination    . pravastatin (PRAVACHOL) 20 MG tablet Take 1 tablet (20 mg total) by mouth daily. 90 tablet 1  . Pregnenolone Micronized (PREGNENOLONE PO) Take 1 capsule by mouth daily.    Marland Kitchen PRESCRIPTION MEDICATION HCG injectable taking 2 ml  Two times weekly    . RESVERATROL PO Take 1 capsule by mouth daily.    Marland Kitchen testosterone cypionate (DEPOTESTOSTERONE CYPIONATE) 200 MG/ML injection Inject 90 mg into the muscle every 7 (seven) days. 0.45 mL Twice a week    . tadalafil (CIALIS) 5 MG tablet Take 0.5-1 tablets (2.5-5 mg total) by mouth daily as needed for erectile dysfunction. 90 tablet 1    Prior to Admission medications    Medication Sig Start Date End Date Taking? Authorizing Provider  aspirin 81 MG tablet Take 81 mg by mouth daily.     Yes [provider]  Multiple Vitamin (MULTIVITAMIN) tablet Take 1 tablet by mouth daily.     Yes [provider]  niacinamide 500 MG tablet Take 500 mg by mouth 2 (two) times daily with a meal.   Yes [provider]  omega-3 acid ethyl esters (LOVAZA) 1 g capsule Take 2 capsules (2 g total) by mouth 2 (two) times daily. 07/12/16  Yes Jeffery, Chelle, PA  OVER THE COUNTER MEDICATION Take 1 capsule by mouth daily. Nitric Oxide   Yes [provider]  OVER THE COUNTER MEDICATION Take 1 capsule by mouth daily. Vitamin A & D Combination   Yes [provider]  pravastatin (PRAVACHOL) 20 MG tablet Take 1 tablet (20 mg total) by mouth daily. 07/12/16  Yes Jeffery, Domingo Mend, PA  Pregnenolone Micronized (PREGNENOLONE PO) Take 1 capsule by mouth daily.   Yes [provider]  PRESCRIPTION MEDICATION HCG injectable taking 2 ml  Two times weekly   Yes [provider]  RESVERATROL PO Take 1 capsule by mouth daily.   Yes [provider]  testosterone cypionate (DEPOTESTOSTERONE CYPIONATE) 200 MG/ML injection Inject 90 mg into the muscle every 7 (seven) days. 0.45 mL Twice a week   Yes [provider]  tadalafil (CIALIS) 5 MG tablet Take 0.5-1 tablets (2.5-5 mg total) by mouth daily as needed for erectile dysfunction. 07/12/16   Harrison Mons, PA    Blood pressure 137/80, pulse 71, temperature (!) 97.4 F (36.3 C), resp. rate 16, height 6' 2"  (1.88 m), weight 97.5 kg, SpO2 94 %. Physical Exam: General: pleasant, WD/WN white male who is laying in bed in NAD HEENT: head is normocephalic, atraumatic.  Sclera are noninjected.  Pupils equal and round.  Ears and nose without any masses or lesions.  Mouth is pink and moist. Dentition fair Heart: regular, rate, and rhythm.  Normal s1,s2. No obvious murmurs, gallops, or rubs noted.   Palpable pedal pulses bilaterally  Lungs: CTAB, no wheezes, rhonchi, or rales noted.  Respiratory effort nonlabored Abd: well healed umbilical incision, soft, NT/ND, +BS, no masses, hernias, or organomegaly MS: no BUE/BLE edema, calves soft and nontender Skin: warm and dry with no masses, lesions, or rashes Psych: A&Ox4 with an appropriate affect Neuro: cranial nerves grossly intact, equal strength in BUE/BLE bilaterally, normal speech, thought process intact  Results for orders placed or performed during the hospital encounter of 08/04/19 (from the past 48 hour(s))  Lipase, blood     Status: None   Collection Time: 08/04/19  1:18 AM  Result Value Ref Range   Lipase 30 11 - 51 U/L    Comment: Performed  at [facility], [place] [address], [place], [place] [address]  Comprehensive metabolic panel     Status: Abnormal   Collection Time: 08/04/19  1:18 AM  Result Value Ref Range   Sodium 137 135 - 145 mmol/L   Potassium 4.1 3.5 - 5.1 mmol/L   Chloride 98 98 - 111 mmol/L   CO2 29 22 - 32 mmol/L   Glucose, Bld 167 (H) 70 - 99 mg/dL    Comment: Glucose reference range applies only to samples taken after fasting for at least 8 hours.   BUN 16 6 - 20 mg/dL   Creatinine, Ser 1.28 (H) 0.61 - 1.24 mg/dL   Calcium 10.6 (H) 8.9 - 10.3 mg/dL   Total Protein 7.8 6.5 - 8.1 g/dL   Albumin 4.4 3.5 - 5.0 g/dL   AST 32 15 - 41 U/L   ALT 38 0 - 44 U/L   Alkaline Phosphatase 50 38 - 126 U/L   Total Bilirubin 1.0 0.3 - 1.2 mg/dL   GFR calc non Af Amer >60 >60 mL/min   GFR calc Af Amer >60 >60 mL/min   Anion gap 10 5 - 15    Comment: Performed at [facility], [place] [address] [place], [place] [address]  CBC     Status: Abnormal   Collection Time: 08/04/19  1:18 AM  Result Value Ref Range   WBC 13.0 (H) 4.0 - 10.5 K/uL   RBC 7.17 (H) 4.22 - 5.81 MIL/uL   Hemoglobin 18.0 (H) 13.0 - 17.0 g/dL   HCT 57.9 (H) 39.0 - 52.0 %   MCV 80.8 80.0 - 100.0 fL   MCH 25.1 (L) 26.0 - 34.0 pg   MCHC 31.1  30.0 - 36.0 g/dL   RDW 16.9 (H) 11.5 - 15.5 %   Platelets 226 150 - 400 K/uL   nRBC 0.0 0.0 - 0.2 %    Comment: Performed at [facility], [place] [address], [place], [place] [address]  Urinalysis, Routine w reflex microscopic     Status: Abnormal   Collection Time: 08/04/19  1:18 AM  Result Value Ref Range   Color, Urine AMBER (A) YELLOW    Comment: BIOCHEMICALS MAY BE AFFECTED BY COLOR   APPearance CLOUDY (A) CLEAR   Specific Gravity, Urine 1.023 1.005 - 1.030   pH 8.0 5.0 - 8.0   Glucose, UA NEGATIVE NEGATIVE mg/dL   Hgb urine dipstick NEGATIVE NEGATIVE   Bilirubin Urine NEGATIVE NEGATIVE   Ketones, ur NEGATIVE NEGATIVE mg/dL   Protein, ur 100 (A) NEGATIVE mg/dL   Nitrite NEGATIVE NEGATIVE   Leukocytes,Ua NEGATIVE NEGATIVE   RBC / HPF 0-5 0 - 5 RBC/hpf   WBC, UA 6-10 0 - 5 WBC/hpf   Bacteria, UA RARE (A) NONE SEEN   Squamous Epithelial / LPF 0-5 0 - 5   Mucus PRESENT    Hyaline Casts, UA PRESENT    Amorphous Crystal PRESENT     Comment: Performed at [facility], [address], [place], [place] [address]  POC SARS Coronavirus 2 Ag-ED - Nasal Swab (BD Veritor Kit)     Status: None   Collection Time: 08/04/19  4:16 AM  Result Value Ref Range   SARS Coronavirus 2 Ag NEGATIVE NEGATIVE    Comment: (NOTE) SARS-CoV-2 antigen NOT DETECTED.  Negative results are presumptive.  Negative results do not preclude SARS-CoV-2 infection and should not be used as the sole basis for treatment or other patient management  decisions, including infection  control decisions, particularly in the presence of clinical signs and  symptoms consistent with COVID-19, or in those who have been in contact with the virus.  Negative results must be combined with clinical observations, patient history, and epidemiological information. The expected result is Negative. Fact Sheet for Patients: PodPark.tn Fact Sheet for Healthcare  Providers: GiftContent.is This test is not yet approved or cleared by the [place] FDA and  has been authorized for detection and/or diagnosis of SARS-CoV-2 by FDA under an Emergency Use Authorization (EUA).  This EUA will remain in effect (meaning this test can be used) for the duration of  the COVID-19 de claration under Section 564(b)(1) of the Act, 21 U.S.C. section 360bbb-3(b)(1), unless the authorization is terminated or revoked sooner.    CT ABDOMEN PELVIS W CONTRAST  Result Date: 08/04/2019 CLINICAL DATA:  Lower abdominal pain, reflux EXAM: CT ABDOMEN AND PELVIS WITH CONTRAST TECHNIQUE: Multidetector CT imaging of the abdomen and pelvis was performed using the standard protocol following bolus administration of intravenous contrast. CONTRAST:  164m OMNIPAQUE IOHEXOL 300 MG/ML  SOLN COMPARISON:  None. FINDINGS: Lower chest: The visualized heart size within normal limits. No pericardial fluid/thickening. There is fluid seen refluxed within to the mildly dilated distal esophagus. The visualized portions of the lungs are clear. Hepatobiliary: The liver is normal in density without focal abnormality.The main portal vein is patent. No evidence of calcified gallstones, gallbladder wall thickening or biliary dilatation. Pancreas: Unremarkable. No pancreatic ductal dilatation or surrounding inflammatory changes. Spleen: Normal in size without focal abnormality. Adrenals/Urinary Tract: Both adrenal glands appear normal. The kidneys and collecting system appear normal without evidence of urinary tract calculus or hydronephrosis. Bladder is unremarkable. Stomach/Bowel: There is a moderately dilated fluid and air-filled stomach. There is also moderately dilated air and fluid-filled loops of proximal small bowel to the level of the distal jejunum measuring up to 4.1 cm in transverse dimension. There is a focal area of narrowing seen within the proximal ileal loops, series 6,  image 30. The remainder of the ileal loops are decompressed. No focal bowel wall thickening is seen. There is a moderate amount of colonic stool with scattered colonic diverticulosis. The appendix is normal in appearance. Vascular/Lymphatic: There are no enlarged mesenteric, retroperitoneal, or pelvic lymph nodes. Scattered aortic atherosclerotic calcifications are seen without aneurysmal dilatation. Reproductive: The prostate is unremarkable. Other: There is trace free fluid seen within the mid abdomen. Musculoskeletal: No acute or significant osseous findings. Grade 1 anterolisthesis of L5 on S1 is seen measuring 3 mm with chronic bilateral pars defects at L5. IMPRESSION: 1. Moderately dilated stomach and small bowel to the level of the proximal ileum where there is a focal area of narrowing, which is likely due to a ileus versus partial small bowel obstruction. 2. Small amount of reflux within a mildly dilated distal esophagus. 3.  Aortic Atherosclerosis (ICD10-I70.0). 4. Diverticulosis without diverticulitis. Electronically Signed   By: BPrudencio PairM.D.   On: 08/04/2019 04:45   DG Abd Portable 1V  Result Date: 08/04/2019 CLINICAL DATA:  NG tube placement. Small bowel obstruction. EXAM: PORTABLE ABDOMEN - 1 VIEW COMPARISON:  CT of the abdomen and pelvis 08/04/2019 FINDINGS: Side port of the NG tube is in the stomach. Lung bases are clear. Dilated loops of small bowel are similar the prior exam. No free air is present. IMPRESSION: Side port of the NG tube is in the stomach. Electronically Signed   By: CSan MorelleM.D.   On:  08/04/2019 06:17     Assessment/Plan HLD AKI - Cr 1.18 Dehydration - WBC, H/H, and Cr elevated, continue IVF  Partial SBO - 1st SBO, h/o left inguinal hernia repair x2 and umbilical hernia repair with mesh 2012 - CT scan shows moderately dilated stomach and small bowel to the level of the proximal ileum where there is a focal area of narrowing, which is likely due to a  ileus versus partial small bowel obstruction  ID - none VTE - SCDs, lovenox FEN - IVF, NPO/NGT Foley - none Follow up - TBD  Plan - Obstruction already seems to be resolving with NG tube as patient is passing flatus and had a bowel movement this morning. Continue NG tube to LIWS for now, I will recheck later today to possibly clamp tube and allow clear liquids. Ok to clamp tube to allow patient to ambulate.  Wellington Hampshire, [facility] 08/04/2019, 9:53 AM Please see Amion for pager number during day hours 7:00am-4:30pm

## 2019-08-04 NOTE — ED Triage Notes (Signed)
Patient reports mid abdominal pain " feels gassy" this evening , no emesis or diarrhea , denies fever or chills .

## 2019-08-04 NOTE — ED Provider Notes (Signed)
Emergency Department Provider Note   I have reviewed the triage vital signs and the nursing notes.   HISTORY  Chief Complaint Abdominal Pain   HPI Paul Rich is a 59 y.o. male with medical history as documented below who presents to the ED with abdominal distension, increased belching, indigestion without history of same. States it felt like he needed to have bm earlier in day and did onece that was soft. Subsequently no bowel movements, symptoms worsening, decreased flatulence. H/o hernia mesh repair in the past. No other surgeries. No fevers but did feel chills once.    No other associated or modifying symptoms.    Past Medical History:  Diagnosis Date  . Allergy    pollen  . AV block   . Childhood asthma   . Environmental allergies   . Hemorrhage of rectum and anus   . Other and unspecified hyperlipidemia     Patient Active Problem List   Diagnosis Date Noted  . Allergic rhinitis due to other allergen 05/24/2011  . Allergy to shellfish 05/24/2011  . OSA (obstructive sleep apnea) 05/21/2011  . HEMORRHOIDS 10/12/2008  . COLONIC POLYPS 08/29/2008  . HYPERLIPIDEMIA 08/29/2008  . RECTAL BLEEDING 08/29/2008    Past Surgical History:  Procedure Laterality Date  . HERNIA REPAIR  2012   inguinal and umbilical    Current Outpatient Rx  . Order #: [id number] Class: Historical Med  . Order #: [id number] Class: Historical Med  . Order #: [id number] Class: Historical Med  . Order #: [id number] Class: Normal  . Order #: [id number] Class: Normal  . Order #: [id number] Class: Historical Med  . Order #: [id number] Class: Normal  . Order #: [id number] Class: Historical Med    Allergies Shellfish allergy and Mold extract [trichophyton]  Family History  Problem Relation Age of Onset  . Diabetes Mother   . Heart disease Mother   . Hyperlipidemia Mother   . Hypertension Mother   . Kidney disease Paternal Grandmother   . Lung cancer Paternal Grandmother   . Lung cancer Paternal  Grandfather   . Heart disease Paternal Grandfather   . Heart disease Maternal Grandmother   . Hyperlipidemia Maternal Grandmother   . Heart disease Maternal Grandfather   . Colon cancer Neg Hx     Social History Social History   Tobacco Use  . Smoking status: Never Smoker  . Smokeless tobacco: Never Used  Substance Use Topics  . Alcohol use: Yes    Alcohol/week: 3.0 - 4.0 standard drinks    Types: 3 - 4 Shots of liquor per week    Comment: occ red wine and beer but usually burbon  . Drug use: No    Review of Systems  All other systems negative except as documented in the HPI. All pertinent positives and negatives as reviewed in the HPI. ____________________________________________   PHYSICAL EXAM:  VITAL SIGNS: ED Triage Vitals [08/04/19 0112]  Enc Vitals Group     BP 119/81     Pulse Rate 75     Resp 14     Temp 97.8 F (36.6 C)     Temp Source Oral     SpO2 97 %     Constitutional: Alert and oriented. Well appearing and in no acute distress. Eyes: Conjunctivae are normal. PERRL. EOMI. Head: Atraumatic. Nose: No congestion/rhinnorhea. Mouth/Throat: Mucous membranes are moist.  Oropharynx non-erythematous. Neck: No stridor.  No meningeal signs.   Cardiovascular: Normal rate, regular rhythm. Good peripheral circulation. Grossly normal heart sounds.   Respiratory: Normal respiratory effort.  No retractions. Lungs CTAB. Gastrointestinal: Soft and nontender. distentded with tympany to percussion.  Musculoskeletal: No lower extremity tenderness nor edema. No gross deformities of extremities. Neurologic:  Normal speech and language. No gross focal neurologic deficits are appreciated.  Skin:  Skin is warm, dry and intact. No rash noted.  ____________________________________________   LABS (all labs ordered are listed, but only abnormal results are displayed)  Labs Reviewed  COMPREHENSIVE METABOLIC PANEL - Abnormal; Notable for the following components:       Result Value   Glucose, Bld 167 (*)    Creatinine, Ser 1.28 (*)    Calcium 10.6 (*)    All other components within normal limits  CBC - Abnormal; Notable for the following components:   WBC 13.0 (*)    RBC 7.17 (*)    Hemoglobin 18.0 (*)    HCT 57.9 (*)    MCH 25.1 (*)    RDW 16.9 (*)    All other components within normal limits  URINALYSIS, ROUTINE W REFLEX MICROSCOPIC - Abnormal; Notable for the following components:   Color, Urine AMBER (*)    APPearance CLOUDY (*)    Protein, ur 100 (*)    Bacteria, UA RARE (*)    All other components within normal limits  LIPASE, BLOOD  POC SARS CORONAVIRUS 2 AG -  ED   ____________________________________________  RADIOLOGY  CT ABDOMEN PELVIS W CONTRAST  Result Date: 08/04/2019 CLINICAL DATA:  Lower abdominal pain, reflux EXAM: CT ABDOMEN AND PELVIS WITH CONTRAST TECHNIQUE: Multidetector CT imaging of the abdomen and pelvis was performed using the standard protocol following bolus administration of intravenous contrast. CONTRAST:  160mL OMNIPAQUE IOHEXOL 300 MG/ML  SOLN COMPARISON:  None. FINDINGS: Lower chest: The visualized heart size within normal limits. No pericardial fluid/thickening. There is fluid seen refluxed within to the mildly dilated distal esophagus. The visualized portions of the lungs are clear. Hepatobiliary: The liver is normal in density without focal abnormality.The main portal vein is patent. No evidence of calcified gallstones, gallbladder wall thickening or biliary dilatation. Pancreas: Unremarkable. No pancreatic ductal dilatation or surrounding inflammatory changes. Spleen: Normal in size without focal abnormality. Adrenals/Urinary Tract: Both adrenal glands appear normal. The kidneys and collecting system appear normal without evidence of urinary tract calculus or hydronephrosis. Bladder is unremarkable. Stomach/Bowel: There is a moderately dilated fluid and air-filled stomach. There is also moderately dilated air and  fluid-filled loops of proximal small bowel to the level of the distal jejunum measuring up to 4.1 cm in transverse dimension. There is a focal area of narrowing seen within the proximal ileal loops, series 6, image 30. The remainder of the ileal loops are decompressed. No focal bowel wall thickening is seen. There is a moderate amount of colonic stool with scattered colonic diverticulosis. The appendix is normal in appearance. Vascular/Lymphatic: There are no enlarged mesenteric, retroperitoneal, or pelvic lymph nodes. Scattered aortic atherosclerotic calcifications are seen without aneurysmal dilatation. Reproductive: The prostate is unremarkable. Other: There is trace free fluid seen within the mid abdomen. Musculoskeletal: No acute or significant osseous findings. Grade 1 anterolisthesis of L5 on S1 is seen measuring 3 mm with chronic bilateral pars defects at L5. IMPRESSION: 1. Moderately dilated stomach and small bowel to the level of the proximal ileum where there is a focal area of narrowing, which is likely due to a ileus versus partial small bowel obstruction. 2. Small amount of reflux within a mildly dilated distal esophagus. 3.  Aortic Atherosclerosis (ICD10-I70.0). 4. Diverticulosis without diverticulitis. Electronically  Signed   By: Prudencio Pair M.D.   On: 08/04/2019 04:45   ____________________________________________   INITIAL IMPRESSION / ASSESSMENT AND PLAN / ED COURSE  Concern for bowel obstruction vs perforation vs new severe indigestion/PUD.  Ct scan c/w partial small bowel obstruction vs ileus with transition point. Still not passing gas, will insert NG tube and d/w medicine for admission.   Pertinent labs & imaging results that were available during my care of the patient were reviewed by me and considered in my medical decision making (see chart for details). ____________________________________________  FINAL CLINICAL IMPRESSION(S) / ED DIAGNOSES  Final diagnoses:  Partial  small bowel obstruction (HCC)   MEDICATIONS GIVEN DURING THIS VISIT:  Medications  sodium chloride flush (NS) 0.9 % injection 3 mL (has no administration in time range)  fentaNYL (SUBLIMAZE) injection 50 mcg (has no administration in time range)  fentaNYL (SUBLIMAZE) injection 50 mcg (50 mcg Intravenous Given 08/04/19 0333)  ondansetron (ZOFRAN) injection 4 mg (4 mg Intravenous Given 08/04/19 0333)  pantoprazole (PROTONIX) injection 40 mg (40 mg Intravenous Given 08/04/19 0334)  alum & mag hydroxide-simeth (MAALOX/MYLANTA) 200-200-20 MG/5ML suspension 30 mL (30 mLs Oral Given 08/04/19 0334)  famotidine (PEPCID) IVPB 20 mg premix (0 mg Intravenous Stopped 08/04/19 0334)  lactated ringers bolus 1,000 mL (1,000 mLs Intravenous New Bag/Given 08/04/19 0333)  iohexol (OMNIPAQUE) 300 MG/ML solution 100 mL (100 mLs Intravenous Contrast Given 08/04/19 0425)     NEW OUTPATIENT MEDICATIONS STARTED DURING THIS VISIT:  New Prescriptions   No medications on file    Note:  This note was prepared with assistance of Dragon voice recognition software. Occasional wrong-word or sound-a-like substitutions may have occurred due to the inherent limitations of voice recognition software.   Jamani Bearce, Corene Cornea, MD 08/04/19 [phone]

## 2019-08-04 NOTE — ED Notes (Signed)
Attempted to call report, unit clerk states RN will call back soon.

## 2019-08-04 NOTE — H&P (Signed)
History and Physical    Paul Rich [id number] DOB: 1960/12/21 DOA: 08/04/2019  PCP: Wendie Agreste, MD Consultants:  Havery Moros - GI Patient coming from:  Home - lives with fiance; NOK:  Izell Wilcox, [phone]  Chief Complaint: Abdominal pain  HPI: Paul Rich is a 59 y.o. male with medical history significant of HLD presenting with abdominal pain.  He reports that yesterday he was working from home.  Ate lunch.  About 2PM, he developed mid-epigastric abdominal pain.  Crampy pain - he tried to stretch it out and wouldn't get better.  He couldn't pass gas or have a BM.  He got up about midnight to try again, not successful, got lightheaded, diaphoretic.  He did not have vomiting then, but vomited x 4 with NG tube placement.  No h/o prior but he did have a prior ventral hernia surgery, and apparently there were recalls associated with the mesh (he was contacted by an attorney regarding possible class action lawsuit).  Also with 2 L-sided inguinal hernia surgeries.    He had moved away and moved back a year ago so he hasn't seen Dr. Carlota Raspberry in 3-4 years ago.  He is planning to go back to him.  ED Course:  SBO, per Dr. Marice Potter.  Review of Systems: As per HPI; otherwise review of systems reviewed and negative.   Ambulatory Status:  Ambulates without assistance  Past Medical History:  Diagnosis Date  . Allergy    pollen  . AV block   . Childhood asthma   . Environmental allergies   . Hemorrhage of rectum and anus   . Other and unspecified hyperlipidemia   . SBO (small bowel obstruction) ([place]) 08/2019  . Testosterone deficiency in male 08/04/2019    Past Surgical History:  Procedure Laterality Date  . HERNIA REPAIR  2012   inguinal and umbilical    Social History   Socioeconomic History  . Marital status: Married    Spouse name: Not on file  . Number of children: Not on file  . Years of education: Not on file  . Highest education level: Not on file    Occupational History  . Occupation: HR exec-business  Tobacco Use  . Smoking status: Never Smoker  . Smokeless tobacco: Never Used  Substance and Sexual Activity  . Alcohol use: Yes    Alcohol/week: 3.0 - 4.0 standard drinks    Types: 3 - 4 Shots of liquor per week    Comment: occ red wine and beer but usually burbon  . Drug use: No  . Sexual activity: Yes  Other Topics Concern  . Not on file  Social History Narrative   Divorced   Exercise: Tes, 4 times a week   Education: PHD   Social Determinants of Health   Financial Resource Strain:   . Difficulty of Paying Living Expenses: Not on file  Food Insecurity:   . Worried About Charity fundraiser in the Last Year: Not on file  . Ran Out of Food in the Last Year: Not on file  Transportation Needs:   . Lack of Transportation (Medical): Not on file  . Lack of Transportation (Non-Medical): Not on file  Physical Activity:   . Days of Exercise per Week: Not on file  . Minutes of Exercise per Session: Not on file  Stress:   . Feeling of Stress : Not on file  Social Connections:   . Frequency of Communication with Friends and Family: Not on file  .  Frequency of Social Gatherings with Friends and Family: Not on file  . Attends Religious Services: Not on file  . Active Member of Clubs or Organizations: Not on file  . Attends Archivist Meetings: Not on file  . Marital Status: Not on file  Intimate Partner Violence:   . Fear of Current or Ex-Partner: Not on file  . Emotionally Abused: Not on file  . Physically Abused: Not on file  . Sexually Abused: Not on file    Allergies  Allergen Reactions  . Shellfish Allergy Anaphylaxis and Swelling    Of swelling of eyes  . Mold Extract [Trichophyton]     Family History  Problem Relation Age of Onset  . Diabetes Mother   . Heart disease Mother   . Hyperlipidemia Mother   . Hypertension Mother   . Kidney disease Paternal Grandmother   . Lung cancer Paternal  Grandmother   . Lung cancer Paternal Grandfather   . Heart disease Paternal Grandfather   . Heart disease Maternal Grandmother   . Hyperlipidemia Maternal Grandmother   . Heart disease Maternal Grandfather   . Colon cancer Neg Hx     Prior to Admission medications   Medication Sig Start Date End Date Taking? Authorizing Provider  aspirin 81 MG tablet Take 81 mg by mouth daily.     Yes [provider]  Multiple Vitamin (MULTIVITAMIN) tablet Take 1 tablet by mouth daily.     Yes [provider]  niacinamide 500 MG tablet Take 500 mg by mouth 2 (two) times daily with a meal.   Yes [provider]  omega-3 acid ethyl esters (LOVAZA) 1 g capsule Take 2 capsules (2 g total) by mouth 2 (two) times daily. 07/12/16  Yes Jeffery, Chelle, PA  OVER THE COUNTER MEDICATION Take 1 capsule by mouth daily. Nitric Oxide   Yes [provider]  OVER THE COUNTER MEDICATION Take 1 capsule by mouth daily. Vitamin A & D Combination   Yes [provider]  pravastatin (PRAVACHOL) 20 MG tablet Take 1 tablet (20 mg total) by mouth daily. 07/12/16  Yes Jeffery, Domingo Mend, PA  Pregnenolone Micronized (PREGNENOLONE PO) Take 1 capsule by mouth daily.   Yes [provider]  PRESCRIPTION MEDICATION HCG injectable taking 2 ml  Two times weekly   Yes [provider]  RESVERATROL PO Take 1 capsule by mouth daily.   Yes [provider]  testosterone cypionate (DEPOTESTOSTERONE CYPIONATE) 200 MG/ML injection Inject 90 mg into the muscle every 7 (seven) days. 0.45 mL Twice a week   Yes [provider]  tadalafil (CIALIS) 5 MG tablet Take 0.5-1 tablets (2.5-5 mg total) by mouth daily as needed for erectile dysfunction. 07/12/16   Harrison Mons, PA    Physical Exam: Vitals:   08/04/19 0545 08/04/19 0600 08/04/19 0615 08/04/19 0651  BP: 118/82 133/85 122/85 137/80  Pulse: 73 72 73 71  Resp: (!) 8 14 18 16   Temp:    (!) 97.4 F (36.3 C)  TempSrc:        SpO2: 93% 93% 94% 94%  Weight:    97.5 kg  Height:    6\' 2"  (1.88 m)     . General:  Appears calm and comfortable and is NAD with NG tube in place; significant brown-green NG tube drainage in canister . Eyes:   EOMI, normal lids, iris . ENT:  grossly normal hearing, lips & tongue, mmm; appropriate dentition . Neck:  no LAD, masses or thyromegaly .  Cardiovascular:  RRR, no m/r/g. No LE edema.  Marland Kitchen Respiratory:   CTA bilaterally with no wheezes/rales/rhonchi.  Normal respiratory effort. . Abdomen:  soft, NT, ND . Skin:  no rash or induration seen on limited exam . Musculoskeletal:  grossly normal tone BUE/BLE, good ROM, no bony abnormality . Psychiatric:  grossly normal mood and affect, speech fluent and appropriate, AOx3 . Neurologic:  CN 2-12 grossly intact, moves all extremities in coordinated fashion    Radiological Exams on Admission: CT ABDOMEN PELVIS W CONTRAST  Result Date: 08/04/2019 CLINICAL DATA:  Lower abdominal pain, reflux EXAM: CT ABDOMEN AND PELVIS WITH CONTRAST TECHNIQUE: Multidetector CT imaging of the abdomen and pelvis was performed using the standard protocol following bolus administration of intravenous contrast. CONTRAST:  17mL OMNIPAQUE IOHEXOL 300 MG/ML  SOLN COMPARISON:  None. FINDINGS: Lower chest: The visualized heart size within normal limits. No pericardial fluid/thickening. There is fluid seen refluxed within to the mildly dilated distal esophagus. The visualized portions of the lungs are clear. Hepatobiliary: The liver is normal in density without focal abnormality.The main portal vein is patent. No evidence of calcified gallstones, gallbladder wall thickening or biliary dilatation. Pancreas: Unremarkable. No pancreatic ductal dilatation or surrounding inflammatory changes. Spleen: Normal in size without focal abnormality. Adrenals/Urinary Tract: Both adrenal glands appear normal. The kidneys and collecting system appear normal without evidence of urinary tract  calculus or hydronephrosis. Bladder is unremarkable. Stomach/Bowel: There is a moderately dilated fluid and air-filled stomach. There is also moderately dilated air and fluid-filled loops of proximal small bowel to the level of the distal jejunum measuring up to 4.1 cm in transverse dimension. There is a focal area of narrowing seen within the proximal ileal loops, series 6, image 30. The remainder of the ileal loops are decompressed. No focal bowel wall thickening is seen. There is a moderate amount of colonic stool with scattered colonic diverticulosis. The appendix is normal in appearance. Vascular/Lymphatic: There are no enlarged mesenteric, retroperitoneal, or pelvic lymph nodes. Scattered aortic atherosclerotic calcifications are seen without aneurysmal dilatation. Reproductive: The prostate is unremarkable. Other: There is trace free fluid seen within the mid abdomen. Musculoskeletal: No acute or significant osseous findings. Grade 1 anterolisthesis of L5 on S1 is seen measuring 3 mm with chronic bilateral pars defects at L5. IMPRESSION: 1. Moderately dilated stomach and small bowel to the level of the proximal ileum where there is a focal area of narrowing, which is likely due to a ileus versus partial small bowel obstruction. 2. Small amount of reflux within a mildly dilated distal esophagus. 3.  Aortic Atherosclerosis (ICD10-I70.0). 4. Diverticulosis without diverticulitis. Electronically Signed   By: Prudencio Pair M.D.   On: 08/04/2019 04:45   DG Abd Portable 1V  Result Date: 08/04/2019 CLINICAL DATA:  NG tube placement. Small bowel obstruction. EXAM: PORTABLE ABDOMEN - 1 VIEW COMPARISON:  CT of the abdomen and pelvis 08/04/2019 FINDINGS: Side port of the NG tube is in the stomach. Lung bases are clear. Dilated loops of small bowel are similar the prior exam. No free air is present. IMPRESSION: Side port of the NG tube is in the stomach. Electronically Signed   By: San Morelle M.D.   On:  08/04/2019 06:17    EKG: not done   Labs on Admission: I have personally reviewed the available labs and imaging studies at the time of the admission.  Pertinent labs:   Glucose 167 BUN 16/Creatinine 1.28/GFR >60 WBC 13.0 Hgb 18.0 BD COVID negative; Hologic pending  Assessment/Plan Principal Problem:   SBO (small bowel obstruction) (HCC) Active Problems:   Dyslipidemia   Testosterone deficiency in male   SBO -Patient with prior h/o 3 abdominal surgeries (ventral hernia and L inguinal hernia x 2) presenting with acute onset of abdominal pain with n/v, abdominal distention, and CT findings c/w SBO -Will admit to Med Surg -Gen Surg consulted and will see this AM; currently no indication for surgical intervention -NPO for bowel rest -NG tube in place -IVF hydration -Pain control with morphine -Already with some flatus and a small BM so hopefully will have quick recovery -Will defer to surgery about whether to do the gastrograffin protocol vs. Consider clamping NG tube later this afternoon  HLD -Hold ASA, Pravachol, Lovaza for now  Testosterone deficiency -Hold testosterone and Cialis while inpatient    Note: This patient has been tested and is negative for the novel coronavirus COVID-19 by BD testing; Hologic confirmatory testing is pending.  DVT prophylaxis:   Lovenox Code Status:  Full - confirmed with patient Family Communication: None present; he is capable of communicating with his fiance at this time. Disposition Plan: He is anticipated to d/c to home without [facility] services once his GI issues have been resolved. Consults called: Surgery  Admission status: Admit - It is my clinical opinion that admission to INPATIENT is reasonable and necessary because of the expectation that this patient will require hospital care that crosses at least 2 midnights to treat this condition based on the medical complexity of the problems presented.  Given the aforementioned information,  the predictability of an adverse outcome is felt to be significant.    Karmen Bongo MD [facility]   How to contact the [facility] Attending or Consulting provider [place] or covering provider during after hours [place], for this patient?  1. Check the care team in [facility] and look for a) attending/consulting [facility] provider listed and b) the [facility] team listed 2. Log into [email or web address] and use [place]'s universal password to access. If you do not have the password, please contact the hospital operator. 3. Locate the [facility] provider you are looking for under [facility] and page to a number that you can be directly reached. 4. If you still have difficulty reaching the provider, please page the [facility] (Director on Call) for the Hospitalists listed on amion for assistance.   08/04/2019, 9:58 AM

## 2019-08-04 NOTE — Plan of Care (Signed)
  Problem: Education: Goal: Knowledge of General Education information will improve Description: Including pain rating scale, medication(s)/side effects and non-pharmacologic comfort measures Outcome: Progressing   Problem: Health Behavior/Discharge Planning: Goal: Ability to manage health-related needs will improve Outcome: Progressing   Problem: Clinical Measurements: Goal: Ability to maintain clinical measurements within normal limits will improve Outcome: Progressing Goal: Respiratory complications will improve Outcome: Progressing   Problem: Activity: Goal: Risk for activity intolerance will decrease Outcome: Progressing   Problem: Elimination: Goal: Will not experience complications related to bowel motility Outcome: Progressing Goal: Will not experience complications related to urinary retention Outcome: Progressing   Problem: Pain Managment: Goal: General experience of comfort will improve Outcome: Progressing   Problem: Safety: Goal: Ability to remain free from injury will improve Outcome: Progressing   Problem: Skin Integrity: Goal: Risk for impaired skin integrity will decrease Outcome: Progressing

## 2019-08-04 NOTE — Progress Notes (Signed)
Received patient from ED, AOX4, ambulatory, VSS, pain at 2/10, abdominal discomfort.  Will endorse to day shift RN appropriately.

## 2019-08-05 DIAGNOSIS — K56600 Partial intestinal obstruction, unspecified as to cause: Secondary | ICD-10-CM | POA: Diagnosis not present

## 2019-08-05 DIAGNOSIS — K56609 Unspecified intestinal obstruction, unspecified as to partial versus complete obstruction: Secondary | ICD-10-CM

## 2019-08-05 LAB — BASIC METABOLIC PANEL
Anion gap: 10 (ref 5–15)
BUN: 17 mg/dL (ref 6–20)
CO2: 28 mmol/L (ref 22–32)
Calcium: 9 mg/dL (ref 8.9–10.3)
Chloride: 101 mmol/L (ref 98–111)
Creatinine, Ser: 1.32 mg/dL — ABNORMAL HIGH (ref 0.61–1.24)
GFR calc Af Amer: >60 mL/min (ref >60)
GFR calc non Af Amer: 59 mL/min — ABNORMAL LOW (ref >60)
Glucose, Bld: 108 mg/dL — ABNORMAL HIGH (ref 70–99)
Potassium: 4.1 mmol/L (ref 3.5–5.1)
Sodium: 139 mmol/L (ref 135–145)

## 2019-08-05 LAB — CBC
HCT: 50.8 % (ref 39.0–52.0)
Hemoglobin: 15.7 g/dL (ref 13.0–17.0)
MCH: 25.1 pg — ABNORMAL LOW (ref 26.0–34.0)
MCHC: 30.9 g/dL (ref 30.0–36.0)
MCV: 81.3 fL (ref 80.0–100.0)
Platelets: 197 10*3/uL (ref 150–400)
RBC: 6.25 MIL/uL — ABNORMAL HIGH (ref 4.22–5.81)
RDW: 15.4 % (ref 11.5–15.5)
WBC: 14.1 10*3/uL — ABNORMAL HIGH (ref 4.0–10.5)
nRBC: 0 % (ref 0.0–0.2)

## 2019-08-05 NOTE — Progress Notes (Signed)
PROGRESS NOTE    Paul Rich  [id number] DOB: [birth year] DOA: 08/04/2019 PCP: Wendie Agreste, MD     Brief Narrative:  Paul Rich is a 59 y.o. WM PMHx HLD B-blocker,  Presenting with abdominal pain.  He reports that yesterday he was working from home.  Ate lunch.  About 2PM, he developed mid-epigastric abdominal pain.  Crampy pain - he tried to stretch it out and wouldn't get better.  He couldn't pass gas or have a BM.  He got up about midnight to try again, not successful, got lightheaded, diaphoretic.  He did not have vomiting then, but vomited x 4 with NG tube placement.  No h/o prior but he did have a prior ventral hernia surgery, and apparently there were recalls associated with the mesh (he was contacted by an attorney regarding possible class action lawsuit).  Also with 2 L-sided inguinal hernia surgeries.    He had moved away and moved back a year ago so he hasn't seen Dr. Carlota Raspberry in 3-4 years ago.  He is planning to go back to him.  ED Course:  SBO, per Dr. Marice Potter.   Subjective: A/O x4, negative CP, negative abdominal pain.   Assessment & Plan:   Principal Problem:   SBO (small bowel obstruction) (HCC) Active Problems:   Dyslipidemia   Testosterone deficiency in male   SBO -Have advance patient's diet to regular if he tolerates overnight will discharge in a.m.   DVT prophylaxis: Lovenox Code Status: Full Family Communication: 3/4 girlfriend at bedside discussed plan of care answered all questions Disposition Plan: Discharge on 3/5 if patient tolerated solid food overnight   Consultants:  CCS   Procedures/Significant Events:    I have personally reviewed and interpreted all radiology studies and my findings are as above.   Antimicrobials: Anti-infectives (From admission, onward)   None        Continuous Infusions: . lactated ringers 75 mL/hr at 08/05/19 0648     Objective: Vitals:   08/04/19 1258 08/04/19 1408 08/04/19 2031  08/05/19 0528  BP: 111/77  122/74 127/77  Pulse: (!) 104  79 78  Resp: 16  18 17   Temp: 98.6 F (37 C) 97.8 F (36.6 C) 98.7 F (37.1 C) 98.2 F (36.8 C)  TempSrc: Oral Oral Oral   SpO2: 93%  95% 94%  Weight:      Height:        Intake/Output Summary (Last 24 hours) at 08/05/2019 1258 Last data filed at 08/05/2019 1000 Gross per 24 hour  Intake 2095.73 ml  Output --  Net 2095.73 ml   Filed Weights   08/04/19 0651  Weight: 97.5 kg    Examination:  General: A/O x4, no acute respiratory distress Eyes: negative scleral hemorrhage, negative anisocoria, negative icterus ENT: Negative Runny nose, negative gingival bleeding, Neck:  Negative scars, masses, torticollis, lymphadenopathy, JVD Lungs: Clear to auscultation bilaterally without wheezes or crackles Cardiovascular: Regular rate and rhythm without murmur gallop or rub normal S1 and S2 Abdomen: negative abdominal pain, nondistended, positive hypoactive bowel sounds, no rebound, no ascites, no appreciable mass Extremities: No significant cyanosis, clubbing, or edema bilateral lower extremities Skin: Negative rashes, lesions, ulcers Psychiatric:  Negative depression, negative anxiety, negative fatigue, negative mania  Central nervous system:  Cranial nerves II through XII intact, tongue/uvula midline, all extremities muscle strength 5/5, sensation intact throughout, negative dysarthria, negative expressive aphasia, negative receptive aphasia.  .     Data Reviewed: Care during the described time interval was provided  by me .  I have reviewed this patient's available data, including medical history, events of note, physical examination, and all test results as part of my evaluation.  CBC: Recent Labs  Lab 08/04/19 0118 08/05/19 0139  WBC 13.0* 14.1*  HGB 18.0* 15.7  HCT 57.9* 50.8  MCV 80.8 81.3  PLT 226 [id number]   Basic Metabolic Panel: Recent Labs  Lab 08/04/19 0118 08/05/19 0139  NA 137 139  K 4.1 4.1  CL 98 101   CO2 29 28  GLUCOSE 167* 108*  BUN 16 17  CREATININE 1.28* 1.32*  CALCIUM 10.6* 9.0   GFR: Estimated Creatinine Clearance: 71.8 mL/min (A) (by C-G formula based on SCr of 1.32 mg/dL (H)). Liver Function Tests: Recent Labs  Lab 08/04/19 0118  AST 32  ALT 38  ALKPHOS 50  BILITOT 1.0  PROT 7.8  ALBUMIN 4.4   Recent Labs  Lab 08/04/19 0118  LIPASE 30   No results for input(s): AMMONIA in the last 168 hours. Coagulation Profile: No results for input(s): INR, PROTIME in the last 168 hours. Cardiac Enzymes: No results for input(s): CKTOTAL, CKMB, CKMBINDEX, TROPONINI in the last 168 hours. BNP (last 3 results) No results for input(s): PROBNP in the last 8760 hours. HbA1C: No results for input(s): HGBA1C in the last 72 hours. CBG: No results for input(s): GLUCAP in the last 168 hours. Lipid Profile: No results for input(s): CHOL, HDL, LDLCALC, TRIG, CHOLHDL, LDLDIRECT in the last 72 hours. Thyroid Function Tests: No results for input(s): TSH, T4TOTAL, FREET4, T3FREE, THYROIDAB in the last 72 hours. Anemia Panel: No results for input(s): VITAMINB12, FOLATE, FERRITIN, TIBC, IRON, RETICCTPCT in the last 72 hours. Sepsis Labs: No results for input(s): PROCALCITON, LATICACIDVEN in the last 168 hours.  Recent Results (from the past 240 hour(s))  SARS CORONAVIRUS 2 (TAT 6-24 HRS) Nasopharyngeal Nasopharyngeal Swab     Status: None   Collection Time: 08/04/19  2:50 PM   Specimen: Nasopharyngeal Swab  Result Value Ref Range Status   SARS Coronavirus 2 NEGATIVE NEGATIVE Final    Comment: (NOTE) SARS-CoV-2 target nucleic acids are NOT DETECTED. The SARS-CoV-2 RNA is generally detectable in upper and lower respiratory specimens during the acute phase of infection. Negative results do not preclude SARS-CoV-2 infection, do not rule out co-infections with other pathogens, and should not be used as the sole basis for treatment or other patient management decisions. Negative results  must be combined with clinical observations, patient history, and epidemiological information. The expected result is Negative. Fact Sheet for Patients: SugarRoll.be Fact Sheet for Healthcare Providers: https://www.Tad Fancher-mathews.com/ This test is not yet approved or cleared by the [place] FDA and  has been authorized for detection and/or diagnosis of SARS-CoV-2 by FDA under an Emergency Use Authorization (EUA). This EUA will remain  in effect (meaning this test can be used) for the duration of the COVID-19 declaration under Section 56 4(b)(1) of the Act, 21 U.S.C. section 360bbb-3(b)(1), unless the authorization is terminated or revoked sooner. Performed at [facility], [place] [address], [place], [place] [address]          Radiology Studies: CT ABDOMEN PELVIS W CONTRAST  Result Date: 08/04/2019 CLINICAL DATA:  Lower abdominal pain, reflux EXAM: CT ABDOMEN AND PELVIS WITH CONTRAST TECHNIQUE: Multidetector CT imaging of the abdomen and pelvis was performed using the standard protocol following bolus administration of intravenous contrast. CONTRAST:  115mL OMNIPAQUE IOHEXOL 300 MG/ML  SOLN COMPARISON:  None. FINDINGS: Lower chest: The visualized heart  size within normal limits. No pericardial fluid/thickening. There is fluid seen refluxed within to the mildly dilated distal esophagus. The visualized portions of the lungs are clear. Hepatobiliary: The liver is normal in density without focal abnormality.The main portal vein is patent. No evidence of calcified gallstones, gallbladder wall thickening or biliary dilatation. Pancreas: Unremarkable. No pancreatic ductal dilatation or surrounding inflammatory changes. Spleen: Normal in size without focal abnormality. Adrenals/Urinary Tract: Both adrenal glands appear normal. The kidneys and collecting system appear normal without evidence of urinary tract calculus or hydronephrosis. Bladder is  unremarkable. Stomach/Bowel: There is a moderately dilated fluid and air-filled stomach. There is also moderately dilated air and fluid-filled loops of proximal small bowel to the level of the distal jejunum measuring up to 4.1 cm in transverse dimension. There is a focal area of narrowing seen within the proximal ileal loops, series 6, image 30. The remainder of the ileal loops are decompressed. No focal bowel wall thickening is seen. There is a moderate amount of colonic stool with scattered colonic diverticulosis. The appendix is normal in appearance. Vascular/Lymphatic: There are no enlarged mesenteric, retroperitoneal, or pelvic lymph nodes. Scattered aortic atherosclerotic calcifications are seen without aneurysmal dilatation. Reproductive: The prostate is unremarkable. Other: There is trace free fluid seen within the mid abdomen. Musculoskeletal: No acute or significant osseous findings. Grade 1 anterolisthesis of L5 on S1 is seen measuring 3 mm with chronic bilateral pars defects at L5. IMPRESSION: 1. Moderately dilated stomach and small bowel to the level of the proximal ileum where there is a focal area of narrowing, which is likely due to a ileus versus partial small bowel obstruction. 2. Small amount of reflux within a mildly dilated distal esophagus. 3.  Aortic Atherosclerosis (ICD10-I70.0). 4. Diverticulosis without diverticulitis. Electronically Signed   By: Prudencio Pair M.D.   On: 08/04/2019 04:45   DG Abd Portable 1V  Result Date: 08/04/2019 CLINICAL DATA:  NG tube placement. Small bowel obstruction. EXAM: PORTABLE ABDOMEN - 1 VIEW COMPARISON:  CT of the abdomen and pelvis 08/04/2019 FINDINGS: Side port of the NG tube is in the stomach. Lung bases are clear. Dilated loops of small bowel are similar the prior exam. No free air is present. IMPRESSION: Side port of the NG tube is in the stomach. Electronically Signed   By: San Morelle M.D.   On: 08/04/2019 06:17        Scheduled  Meds: . enoxaparin (LOVENOX) injection  40 mg Subcutaneous Q24H  . Melatonin  3 mg Oral QHS   Continuous Infusions: . lactated ringers 75 mL/hr at 08/05/19 0648     LOS: 1 day    Time spent:40 min    Gera Inboden, Geraldo Docker, MD [facility] Pager [phone]  If 7PM-7AM, please contact night-coverage [email or web address] Password [facility] 08/05/2019, 12:58 PM

## 2019-08-05 NOTE — Progress Notes (Addendum)
[facility] Progress Note     Subjective: Patient denies abdominal pain. Reports he has tolerated CLD with NGT clamped. Having BMs. Patient reports he feels "like I could hit the gym"  Review of Systems  Constitutional: Negative for chills and fever.  Respiratory: Negative for shortness of breath and wheezing.   Cardiovascular: Negative for chest pain.  Gastrointestinal: Negative for abdominal pain, blood in stool, constipation, nausea and vomiting.  Genitourinary: Negative for dysuria, frequency and urgency.     Objective: Vital signs in last 24 hours: Temp:  [97.8 F (36.6 C)-98.7 F (37.1 C)] 98.2 F (36.8 C) (03/04 0528) Pulse Rate:  [78-104] 78 (03/04 0528) Resp:  [16-18] 17 (03/04 0528) BP: (111-127)/(74-77) 127/77 (03/04 0528) SpO2:  [93 %-95 %] 94 % (03/04 0528) Last BM Date: 08/04/19  Intake/Output from previous day: 03/03 0701 - 03/04 0700 In: 1855.7 [P.O.:360; I.V.:1495.7] Out: 600 [Emesis/NG output:600] Intake/Output this shift: No intake/output data recorded.  PE: General: pleasant, WD, WN white male who is laying in bed in NAD HEENT: head is normocephalic, atraumatic.  Sclera are noninjected.  PERRL.  Ears and nose without any masses or lesions.  Mouth is pink and moist Heart: regular, rate, and rhythm.  Normal s1,s2. No obvious murmurs, gallops, or rubs noted.  Palpable radial and pedal pulses bilaterally Lungs: CTAB, no wheezes, rhonchi, or rales noted.  Respiratory effort nonlabored Abd: soft, NT, ND, +BS, no masses, hernias, or organomegaly MS: all 4 extremities are symmetrical with no cyanosis, clubbing, or edema. Skin: warm and dry with no masses, lesions, or rashes Neuro: Cranial nerves 2-12 grossly intact, sensation grossly intact throughout  Psych: A&Ox3 with an appropriate affect.   Lab Results:  Recent Labs    08/04/19 0118 08/05/19 0139  WBC 13.0* 14.1*  HGB 18.0* 15.7  HCT 57.9* 50.8  PLT 226 197   BMET Recent Labs     08/04/19 0118 08/05/19 0139  NA 137 139  K 4.1 4.1  CL 98 101  CO2 29 28  GLUCOSE 167* 108*  BUN 16 17  CREATININE 1.28* 1.32*  CALCIUM 10.6* 9.0   PT/INR No results for input(s): LABPROT, INR in the last 72 hours. CMP     Component Value Date/Time   NA 139 08/05/2019 0139   K 4.1 08/05/2019 0139   CL 101 08/05/2019 0139   CO2 28 08/05/2019 0139   GLUCOSE 108 (H) 08/05/2019 0139   BUN 17 08/05/2019 0139   CREATININE 1.32 (H) 08/05/2019 0139   CREATININE 1.00 03/17/2012 0823   CALCIUM 9.0 08/05/2019 0139   PROT 7.8 08/04/2019 0118   ALBUMIN 4.4 08/04/2019 0118   AST 32 08/04/2019 0118   ALT 38 08/04/2019 0118   ALKPHOS 50 08/04/2019 0118   BILITOT 1.0 08/04/2019 0118   GFRNONAA 59 (L) 08/05/2019 0139   GFRAA >60 08/05/2019 0139   Lipase     Component Value Date/Time   LIPASE 30 08/04/2019 0118       Studies/Results: CT ABDOMEN PELVIS W CONTRAST  Result Date: 08/04/2019 CLINICAL DATA:  Lower abdominal pain, reflux EXAM: CT ABDOMEN AND PELVIS WITH CONTRAST TECHNIQUE: Multidetector CT imaging of the abdomen and pelvis was performed using the standard protocol following bolus administration of intravenous contrast. CONTRAST:  138mL OMNIPAQUE IOHEXOL 300 MG/ML  SOLN COMPARISON:  None. FINDINGS: Lower chest: The visualized heart size within normal limits. No pericardial fluid/thickening. There is fluid seen refluxed within to the mildly dilated distal esophagus. The visualized portions of the lungs  are clear. Hepatobiliary: The liver is normal in density without focal abnormality.The main portal vein is patent. No evidence of calcified gallstones, gallbladder wall thickening or biliary dilatation. Pancreas: Unremarkable. No pancreatic ductal dilatation or surrounding inflammatory changes. Spleen: Normal in size without focal abnormality. Adrenals/Urinary Tract: Both adrenal glands appear normal. The kidneys and collecting system appear normal without evidence of urinary tract  calculus or hydronephrosis. Bladder is unremarkable. Stomach/Bowel: There is a moderately dilated fluid and air-filled stomach. There is also moderately dilated air and fluid-filled loops of proximal small bowel to the level of the distal jejunum measuring up to 4.1 cm in transverse dimension. There is a focal area of narrowing seen within the proximal ileal loops, series 6, image 30. The remainder of the ileal loops are decompressed. No focal bowel wall thickening is seen. There is a moderate amount of colonic stool with scattered colonic diverticulosis. The appendix is normal in appearance. Vascular/Lymphatic: There are no enlarged mesenteric, retroperitoneal, or pelvic lymph nodes. Scattered aortic atherosclerotic calcifications are seen without aneurysmal dilatation. Reproductive: The prostate is unremarkable. Other: There is trace free fluid seen within the mid abdomen. Musculoskeletal: No acute or significant osseous findings. Grade 1 anterolisthesis of L5 on S1 is seen measuring 3 mm with chronic bilateral pars defects at L5. IMPRESSION: 1. Moderately dilated stomach and small bowel to the level of the proximal ileum where there is a focal area of narrowing, which is likely due to a ileus versus partial small bowel obstruction. 2. Small amount of reflux within a mildly dilated distal esophagus. 3.  Aortic Atherosclerosis (ICD10-I70.0). 4. Diverticulosis without diverticulitis. Electronically Signed   By: Prudencio Pair M.D.   On: 08/04/2019 04:45   DG Abd Portable 1V  Result Date: 08/04/2019 CLINICAL DATA:  NG tube placement. Small bowel obstruction. EXAM: PORTABLE ABDOMEN - 1 VIEW COMPARISON:  CT of the abdomen and pelvis 08/04/2019 FINDINGS: Side port of the NG tube is in the stomach. Lung bases are clear. Dilated loops of small bowel are similar the prior exam. No free air is present. IMPRESSION: Side port of the NG tube is in the stomach. Electronically Signed   By: San Morelle M.D.   On:  08/04/2019 06:17    Anti-infectives: Anti-infectives (From admission, onward)   None       Assessment/Plan HLD AKI - Cr 1.32, IVF per primary team  Partial SBO  - 1st SBO, h/o left inguinal hernia repair x2 and umbilical hernia repair with mesh 2012 - CT scan shows moderately dilated stomach and small bowel to the level of the proximal ileum where there is a focal area of narrowing, which is likely due to a ileus versus partial small bowel obstruction - patient tolerating CLD with NGT clamped and having bowel movements - d/c NGT and start soft diet - if tolerating, could d/c home later today  ID - none VTE - SCDs, lovenox FEN - soft diet  Foley - none Follow up - TBD  Plan - Patient may have had more of an enteritis with mild ileus. Improving, advance diet and recommend discharge later today if tolerating diet advancement.   LOS: 1 day    Brigid Re , [facility] 08/05/2019, 9:07 AM Please see Amion for pager number during day hours 7:00am-4:30pm

## 2019-08-06 DIAGNOSIS — K56609 Unspecified intestinal obstruction, unspecified as to partial versus complete obstruction: Secondary | ICD-10-CM | POA: Diagnosis not present

## 2019-08-06 LAB — COMPREHENSIVE METABOLIC PANEL
ALT: 29 U/L (ref 0–44)
AST: 22 U/L (ref 15–41)
Albumin: 3.5 g/dL (ref 3.5–5.0)
Alkaline Phosphatase: 41 U/L (ref 38–126)
Anion gap: 10 (ref 5–15)
BUN: 14 mg/dL (ref 6–20)
CO2: 28 mmol/L (ref 22–32)
Calcium: 9.2 mg/dL (ref 8.9–10.3)
Chloride: 101 mmol/L (ref 98–111)
Creatinine, Ser: 1.21 mg/dL (ref 0.61–1.24)
GFR calc Af Amer: >60 mL/min (ref >60)
GFR calc non Af Amer: >60 mL/min (ref >60)
Glucose, Bld: 85 mg/dL (ref 70–99)
Potassium: 3.9 mmol/L (ref 3.5–5.1)
Sodium: 139 mmol/L (ref 135–145)
Total Bilirubin: 0.6 mg/dL (ref 0.3–1.2)
Total Protein: 6.5 g/dL (ref 6.5–8.1)

## 2019-08-06 LAB — CBC
HCT: 50 % (ref 39.0–52.0)
Hemoglobin: 15.4 g/dL (ref 13.0–17.0)
MCH: 25 pg — ABNORMAL LOW (ref 26.0–34.0)
MCHC: 30.8 g/dL (ref 30.0–36.0)
MCV: 81.3 fL (ref 80.0–100.0)
Platelets: 169 10*3/uL (ref 150–400)
RBC: 6.15 MIL/uL — ABNORMAL HIGH (ref 4.22–5.81)
RDW: 15.5 % (ref 11.5–15.5)
WBC: 11 10*3/uL — ABNORMAL HIGH (ref 4.0–10.5)
nRBC: 0 % (ref 0.0–0.2)

## 2019-08-06 LAB — PHOSPHORUS: Phosphorus: 3 mg/dL (ref 2.5–4.6)

## 2019-08-06 LAB — MAGNESIUM: Magnesium: 1.7 mg/dL (ref 1.7–2.4)

## 2019-08-06 MED ORDER — ONDANSETRON HCL 4 MG PO TABS: 4.0000 mg | ORAL_TABLET | Freq: Four times a day (QID) | ORAL | 0 refills | Status: DC | PRN

## 2019-08-06 NOTE — Discharge Summary (Addendum)
Physician Discharge Summary  Jerremy Haviland [id number] DOB: 1960-09-01 DOA: 08/04/2019  PCP: Wendie Agreste, MD  Admit date: 08/04/2019 Discharge date: 08/06/2019  Time spent: 30 minutes  Recommendations for Outpatient Follow-up:   SBO -Tolerated diet overnight stable for discharge. -Counseled on advancing diet slowly at home, starting off with bland diet.     Discharge Diagnoses:  Principal Problem:   SBO (small bowel obstruction) (HCC) Active Problems:   Dyslipidemia   Testosterone deficiency in male   Discharge Condition: Stable  Diet recommendation: Regular; bland  Filed Weights   08/04/19 0651  Weight: 97.5 kg    History of present illness:  59 y.o.WM PMHx HLD B-blocker,  Presenting with abdominal pain.He reports that yesterday he was working from home. Ate lunch. About 2PM, he developed mid-epigastric abdominal pain. Crampy pain - he tried to stretch it out and wouldn't get better. He couldn't pass gas or have a BM. He got up about midnight to try again, not successful, got lightheaded, diaphoretic. He did not have vomiting then, but vomited x 4 with NG tube placement. No h/o prior but he did have a prior ventral hernia surgery, and apparently there were recalls associated with the mesh (he was contacted by an attorney regarding possible class action lawsuit). Also with 2 L-sided inguinal hernia surgeries.   He had moved away and moved back a year ago so he hasn't seen Dr. Carlota Raspberry in 3-4 years ago. He is planning to go back to him.  Hospital Course:  Patient treated for SBO with bowel rest and NG tube placement.  Patient's SBO resolved stable for discharge    Discharge Exam: Vitals:   08/05/19 0528 08/05/19 1542 08/05/19 2047 08/06/19 0505  BP: 127/77 127/78 140/89 122/85  Pulse: 78 68 76 62  Resp: 17  17 18   Temp: 98.2 F (36.8 C)  97.9 F (36.6 C) 97.9 F (36.6 C)  TempSrc:    Oral  SpO2: 94% 97% 96% 95%  Weight:      Height:         General: A/O x4, no acute respiratory distress Eyes: negative scleral hemorrhage, negative anisocoria, negative icterus ENT: Negative Runny nose, negative gingival bleeding, Neck:  Negative scars, masses, torticollis, lymphadenopathy, JVD Lungs: Clear to auscultation bilaterally without wheezes or crackles Cardiovascular: Regular rate and rhythm without murmur gallop or rub normal S1 and S2 Abdomen: negative abdominal pain, nondistended, positive hypoactive bowel sounds, no rebound, no ascites, no appreciable mass   Discharge Instructions   Allergies as of 08/06/2019      Reactions   Shellfish Allergy Anaphylaxis, Swelling   Of swelling of eyes   Mold Extract [trichophyton]       Medication List    TAKE these medications   aspirin 81 MG tablet Take 81 mg by mouth daily.   multivitamin tablet Take 1 tablet by mouth daily.   niacinamide 500 MG tablet Take 500 mg by mouth 2 (two) times daily with a meal.   omega-3 acid ethyl esters 1 g capsule Commonly known as: LOVAZA Take 2 capsules (2 g total) by mouth 2 (two) times daily.   ondansetron 4 MG tablet Commonly known as: ZOFRAN Take 1 tablet (4 mg total) by mouth every 6 (six) hours as needed for nausea.   OVER THE COUNTER MEDICATION Take 1 capsule by mouth daily. Nitric Oxide   OVER THE COUNTER MEDICATION Take 1 capsule by mouth daily. Vitamin A & D Combination   pravastatin 20 MG tablet Commonly known  as: PRAVACHOL Take 1 tablet (20 mg total) by mouth daily.   PREGNENOLONE PO Take 1 capsule by mouth daily.   PRESCRIPTION MEDICATION HCG injectable taking 2 ml  Two times weekly   RESVERATROL PO Take 1 capsule by mouth daily.   tadalafil 5 MG tablet Commonly known as: Cialis Take 0.5-1 tablets (2.5-5 mg total) by mouth daily as needed for erectile dysfunction.   testosterone cypionate 200 MG/ML injection Commonly known as: DEPOTESTOSTERONE CYPIONATE Inject 90 mg into the muscle every 7 (seven) days. 0.45  mL Twice a week      Allergies  Allergen Reactions  . Shellfish Allergy Anaphylaxis and Swelling    Of swelling of eyes  . Mold Extract [Trichophyton]       The results of significant diagnostics from this hospitalization (including imaging, microbiology, ancillary and laboratory) are listed below for reference.    Significant Diagnostic Studies: CT ABDOMEN PELVIS W CONTRAST  Result Date: 08/04/2019 CLINICAL DATA:  Lower abdominal pain, reflux EXAM: CT ABDOMEN AND PELVIS WITH CONTRAST TECHNIQUE: Multidetector CT imaging of the abdomen and pelvis was performed using the standard protocol following bolus administration of intravenous contrast. CONTRAST:  145mL OMNIPAQUE IOHEXOL 300 MG/ML  SOLN COMPARISON:  None. FINDINGS: Lower chest: The visualized heart size within normal limits. No pericardial fluid/thickening. There is fluid seen refluxed within to the mildly dilated distal esophagus. The visualized portions of the lungs are clear. Hepatobiliary: The liver is normal in density without focal abnormality.The main portal vein is patent. No evidence of calcified gallstones, gallbladder wall thickening or biliary dilatation. Pancreas: Unremarkable. No pancreatic ductal dilatation or surrounding inflammatory changes. Spleen: Normal in size without focal abnormality. Adrenals/Urinary Tract: Both adrenal glands appear normal. The kidneys and collecting system appear normal without evidence of urinary tract calculus or hydronephrosis. Bladder is unremarkable. Stomach/Bowel: There is a moderately dilated fluid and air-filled stomach. There is also moderately dilated air and fluid-filled loops of proximal small bowel to the level of the distal jejunum measuring up to 4.1 cm in transverse dimension. There is a focal area of narrowing seen within the proximal ileal loops, series 6, image 30. The remainder of the ileal loops are decompressed. No focal bowel wall thickening is seen. There is a moderate amount of  colonic stool with scattered colonic diverticulosis. The appendix is normal in appearance. Vascular/Lymphatic: There are no enlarged mesenteric, retroperitoneal, or pelvic lymph nodes. Scattered aortic atherosclerotic calcifications are seen without aneurysmal dilatation. Reproductive: The prostate is unremarkable. Other: There is trace free fluid seen within the mid abdomen. Musculoskeletal: No acute or significant osseous findings. Grade 1 anterolisthesis of L5 on S1 is seen measuring 3 mm with chronic bilateral pars defects at L5. IMPRESSION: 1. Moderately dilated stomach and small bowel to the level of the proximal ileum where there is a focal area of narrowing, which is likely due to a ileus versus partial small bowel obstruction. 2. Small amount of reflux within a mildly dilated distal esophagus. 3.  Aortic Atherosclerosis (ICD10-I70.0). 4. Diverticulosis without diverticulitis. Electronically Signed   By: Prudencio Pair M.D.   On: 08/04/2019 04:45   DG Abd Portable 1V  Result Date: 08/04/2019 CLINICAL DATA:  NG tube placement. Small bowel obstruction. EXAM: PORTABLE ABDOMEN - 1 VIEW COMPARISON:  CT of the abdomen and pelvis 08/04/2019 FINDINGS: Side port of the NG tube is in the stomach. Lung bases are clear. Dilated loops of small bowel are similar the prior exam. No free air is present. IMPRESSION: Side port of  the NG tube is in the stomach. Electronically Signed   By: San Morelle M.D.   On: 08/04/2019 06:17    Microbiology: Recent Results (from the past 240 hour(s))  SARS CORONAVIRUS 2 (TAT 6-24 HRS) Nasopharyngeal Nasopharyngeal Swab     Status: None   Collection Time: 08/04/19  2:50 PM   Specimen: Nasopharyngeal Swab  Result Value Ref Range Status   SARS Coronavirus 2 NEGATIVE NEGATIVE Final    Comment: (NOTE) SARS-CoV-2 target nucleic acids are NOT DETECTED. The SARS-CoV-2 RNA is generally detectable in upper and lower respiratory specimens during the acute phase of infection.  Negative results do not preclude SARS-CoV-2 infection, do not rule out co-infections with other pathogens, and should not be used as the sole basis for treatment or other patient management decisions. Negative results must be combined with clinical observations, patient history, and epidemiological information. The expected result is Negative. Fact Sheet for Patients: SugarRoll.be Fact Sheet for Healthcare Providers: https://www.Jachin Coury-mathews.com/ This test is not yet approved or cleared by the [place] FDA and  has been authorized for detection and/or diagnosis of SARS-CoV-2 by FDA under an Emergency Use Authorization (EUA). This EUA will remain  in effect (meaning this test can be used) for the duration of the COVID-19 declaration under Section 56 4(b)(1) of the Act, 21 U.S.C. section 360bbb-3(b)(1), unless the authorization is terminated or revoked sooner. Performed at [facility], [place] [address] [place], [place] [address]      Labs: Basic Metabolic Panel: Recent Labs  Lab 08/04/19 0118 08/05/19 0139 08/06/19 0215  NA 137 139 139  K 4.1 4.1 3.9  CL 98 101 101  CO2 29 28 28   GLUCOSE 167* 108* 85  BUN 16 17 14   CREATININE 1.28* 1.32* 1.21  CALCIUM 10.6* 9.0 9.2  MG  --   --  1.7  PHOS  --   --  3.0   Liver Function Tests: Recent Labs  Lab 08/04/19 0118 08/06/19 0215  AST 32 22  ALT 38 29  ALKPHOS 50 41  BILITOT 1.0 0.6  PROT 7.8 6.5  ALBUMIN 4.4 3.5   Recent Labs  Lab 08/04/19 0118  LIPASE 30   No results for input(s): AMMONIA in the last 168 hours. CBC: Recent Labs  Lab 08/04/19 0118 08/05/19 0139 08/06/19 0215  WBC 13.0* 14.1* 11.0*  HGB 18.0* 15.7 15.4  HCT 57.9* 50.8 50.0  MCV 80.8 81.3 81.3  PLT 226 197 169   Cardiac Enzymes: No results for input(s): CKTOTAL, CKMB, CKMBINDEX, TROPONINI in the last 168 hours. BNP: BNP (last 3 results) No results for input(s): BNP in the last 8760  hours.  ProBNP (last 3 results) No results for input(s): PROBNP in the last 8760 hours.  CBG: No results for input(s): GLUCAP in the last 168 hours.     Signed:  Dia Crawford, MD [facility] [phone] pager

## 2019-08-12 ENCOUNTER — Ambulatory Visit: Payer: BLUE CROSS/BLUE SHIELD | Admitting: Family Medicine

## 2019-08-18 ENCOUNTER — Other Ambulatory Visit: Payer: Self-pay

## 2019-08-18 ENCOUNTER — Encounter: Payer: Self-pay | Admitting: Family Medicine

## 2019-08-18 ENCOUNTER — Ambulatory Visit: Payer: BC Managed Care – PPO | Admitting: Family Medicine

## 2019-08-18 VITALS — BP 128/82 | HR 70 | Temp 98.1°F | Ht 74.0 in | Wt 224.0 lb

## 2019-08-18 DIAGNOSIS — K56609 Unspecified intestinal obstruction, unspecified as to partial versus complete obstruction: Secondary | ICD-10-CM | POA: Diagnosis not present

## 2019-08-18 DIAGNOSIS — N529 Male erectile dysfunction, unspecified: Secondary | ICD-10-CM | POA: Diagnosis not present

## 2019-08-18 DIAGNOSIS — E785 Hyperlipidemia, unspecified: Secondary | ICD-10-CM

## 2019-08-18 MED ORDER — TADALAFIL 5 MG PO TABS: 2.5000 mg | ORAL_TABLET | Freq: Every day | ORAL | 1 refills | Status: DC | PRN

## 2019-08-18 MED ORDER — OMEGA-3-ACID ETHYL ESTERS 1 G PO CAPS: 2.0000 g | ORAL_CAPSULE | Freq: Two times a day (BID) | ORAL | 1 refills | Status: DC

## 2019-08-18 MED ORDER — PRAVASTATIN SODIUM 20 MG PO TABS: 20.0000 mg | ORAL_TABLET | Freq: Every day | ORAL | 1 refills | Status: DC

## 2019-08-18 NOTE — Patient Instructions (Addendum)
  Here is a link to the CDC, which provides information on individuals who may be at increased risk of severe illness with Covid: http://huff.org/  Meds refilled today, please bring me a copy of your labs from [facility] once those are performed.  Follow-up in 6 months for physical, but please let me know if there are any questions sooner.  If any return of abdominal pain, nausea or vomiting, be seen right away.   If you have lab work done today you will be contacted with your lab results within the next 2 weeks.  If you have not heard from [place] then please contact us. The fastest way to get your results is to register for My Chart.   IF you received an x-ray today, you will receive an invoice from [facility]. Please contact [facility] at [phone] with questions or concerns regarding your invoice.   IF you received labwork today, you will receive an invoice from [place]. Please contact [facility] at [phone] with questions or concerns regarding your invoice.   Our billing staff will not be able to assist you with questions regarding bills from these companies.  You will be contacted with the lab results as soon as they are available. The fastest way to get your results is to activate your My Chart account. Instructions are located on the last page of this paperwork. If you have not heard from [place] regarding the results in 2 weeks, please contact this office.        If you have lab work done today you will be contacted with your lab results within the next 2 weeks.  If you have not heard from [place] then please contact us. The fastest way to get your results is to register for My Chart.   IF you received an x-ray today, you will receive an invoice from [facility]. Please contact [facility] at [phone] with questions or concerns regarding your invoice.   IF you received labwork today, you  will receive an invoice from [facility]. Please contact [facility] at [phone] with questions or concerns regarding your invoice.   Our billing staff will not be able to assist you with questions regarding bills from these companies.  You will be contacted with the lab results as soon as they are available. The fastest way to get your results is to activate your My Chart account. Instructions are located on the last page of this paperwork. If you have not heard from [place] regarding the results in 2 weeks, please contact this office.

## 2019-08-18 NOTE — Progress Notes (Signed)
Subjective:  Patient ID: Paul Rich, male    DOB: Dec 07, 1960  Age: 59 y.o. MRN: [id number]  CC:  Chief Complaint  Patient presents with  . Hospitalization Follow-up    pt had an abdonminal blockage. 2 weeks ago. pt states he hasn't had any issues from. pt hadn't eatten anything abdormal for him. pt states they told him that the bloackage my have been caused by a buildup of scar tissue behind the mesh that was usaed to fix a hernnia 10-15 years ago.  . Establish Care    pt states he feel good with complants. pt states medication woprks well with no side effects    HPI Paul Rich presents for   Reestablish care and hospital follow-up. Had moved to [facility], now working with Mickeal Skinner - leads center of expertise. Planning on getting married later this year.  Had ongoing  Father died in 07-05-2022, mother passed last week.  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   Small bowel obstruction: Admitted March 3 through March 5.  Treated with bowel rest and nasogastric tube placement.  Resolved without intervention.  Ventral hernia surgery repair previously, as well as inguinal hernia repair x2.  Surgery consulted.  For small bowel obstruction,.  Thought to have ileus versus partial small bowel obstruction at proximal ileum on CT scan.  Possible enteritis with mild ileus.  Tolerating clamping the nasogastric tube and bowel movements return.  Discharged home. Has been doing well. No n/v, no abdominal pain. Feels well now.   Hyperlipidemia: Previously treated in 2017 with Pravastatin and Lovaza. Doing well on current regimen. Has ongoing labs with [facility].   Lab Results  Component Value Date   CHOL 174 02/29/2016   HDL 33 (L) 02/29/2016   LDLCALC 92 02/29/2016   TRIG 243 (H) 02/29/2016   CHOLHDL 5.3 (H) 02/29/2016   Lab Results  Component Value Date   ALT 29 08/06/2019   AST 22 08/06/2019   ALKPHOS 41 08/06/2019   BILITOT 0.6 08/06/2019    Hypogonadism: Treated by [facility] when I was following him in 2017.  Was using Cialis approximately 3 times per week for erectile dysfunction, and was on testosterone injections. Still receiving testosterone injections at [facility] - has blood work few weeks.  No new side effects, no ha/flushing, vision or hearing changes. No CP with exertion.  Exercising.   History Patient Active Problem List   Diagnosis Date Noted  . SBO (small bowel obstruction) ([place]) 08/04/2019  . Testosterone deficiency in male 08/04/2019  . Allergic rhinitis due to other allergen 05/24/2011  . Allergy to shellfish 05/24/2011  . OSA (obstructive sleep apnea) 05/21/2011  . HEMORRHOIDS 10/12/2008  . COLONIC POLYPS 08/29/2008  . Dyslipidemia 08/29/2008  . RECTAL BLEEDING 08/29/2008   Past Medical History:  Diagnosis Date  . Allergy    pollen  . AV block   . Childhood asthma   . Environmental allergies   . Hemorrhage of rectum and anus   . Other and unspecified hyperlipidemia   . SBO (small bowel obstruction) ([place]) 08/2019  . Testosterone deficiency in male 08/04/2019   Past Surgical History:  Procedure Laterality Date  . HERNIA REPAIR  2012   inguinal and umbilical   Allergies  Allergen Reactions  . Shellfish Allergy Anaphylaxis and Swelling    Of swelling of eyes  . Mold Extract [Trichophyton]    Prior to Admission medications   Medication Sig Start Date  End Date Taking? Authorizing Provider  aspirin 81 MG tablet Take 81 mg by mouth daily.      [provider]  Multiple Vitamin (MULTIVITAMIN) tablet Take 1 tablet by mouth daily.      [provider]  niacinamide 500 MG tablet Take 500 mg by mouth 2 (two) times daily with a meal.    [provider]  omega-3 acid ethyl esters (LOVAZA) 1 g capsule Take 2 capsules (2 g total) by mouth 2 (two) times daily. 07/12/16   Harrison Mons, PA  ondansetron (ZOFRAN) 4 MG tablet Take 1 tablet (4 mg total) by mouth every 6  (six) hours as needed for nausea. 08/06/19   Allie Bossier, MD  OVER THE COUNTER MEDICATION Take 1 capsule by mouth daily. Nitric Oxide    [provider]  OVER THE COUNTER MEDICATION Take 1 capsule by mouth daily. Vitamin A & D Combination    [provider]  pravastatin (PRAVACHOL) 20 MG tablet Take 1 tablet (20 mg total) by mouth daily. 07/12/16   Harrison Mons, PA  Pregnenolone Micronized (PREGNENOLONE PO) Take 1 capsule by mouth daily.    [provider]  PRESCRIPTION MEDICATION HCG injectable taking 2 ml  Two times weekly    [provider]  RESVERATROL PO Take 1 capsule by mouth daily.    [provider]  tadalafil (CIALIS) 5 MG tablet Take 0.5-1 tablets (2.5-5 mg total) by mouth daily as needed for erectile dysfunction. 07/12/16   Harrison Mons, PA  testosterone cypionate (DEPOTESTOSTERONE CYPIONATE) 200 MG/ML injection Inject 90 mg into the muscle every 7 (seven) days. 0.45 mL Twice a week    [provider]   Social History   Socioeconomic History  . Marital status: Married    Spouse name: Not on file  . Number of children: Not on file  . Years of education: Not on file  . Highest education level: Not on file  Occupational History  . Occupation: HR exec-business  Tobacco Use  . Smoking status: Never Smoker  . Smokeless tobacco: Never Used  Substance and Sexual Activity  . Alcohol use: Yes    Alcohol/week: 3.0 - 4.0 standard drinks    Types: 3 - 4 Shots of liquor per week    Comment: occ red wine and beer but usually burbon  . Drug use: No  . Sexual activity: Yes  Other Topics Concern  . Not on file  Social History Narrative   Divorced   Exercise: Tes, 4 times a week   Education: PHD   Social Determinants of Health   Financial Resource Strain:   . Difficulty of Paying Living Expenses:   Food Insecurity:   . Worried About Charity fundraiser in the Last Year:   . Arboriculturist in the Last Year:     Transportation Needs:   . Film/video editor (Medical):   Marland Kitchen Lack of Transportation (Non-Medical):   Physical Activity:   . Days of Exercise per Week:   . Minutes of Exercise per Session:   Stress:   . Feeling of Stress :   Social Connections:   . Frequency of Communication with Friends and Family:   . Frequency of Social Gatherings with Friends and Family:   . Attends Religious Services:   . Active Member of Clubs or Organizations:   . Attends Archivist Meetings:   Marland Kitchen Marital Status:   Intimate Partner Violence:   . Fear of Current  or Ex-Partner:   . Emotionally Abused:   Marland Kitchen Physically Abused:   . Sexually Abused:     Review of Systems   Objective:   Vitals:   08/18/19 1555  BP: 128/82  Pulse: 70  Temp: 98.1 F (36.7 C)  TempSrc: Temporal  SpO2: 98%  Weight: 224 lb (101.6 kg)  Height: 6\' 2"  (1.88 m)     Physical Exam Vitals reviewed.  Constitutional:      Appearance: He is well-developed.  HENT:     Head: Normocephalic and atraumatic.  Eyes:     Pupils: Pupils are equal, round, and reactive to light.  Neck:     Vascular: No carotid bruit or JVD.  Cardiovascular:     Rate and Rhythm: Normal rate and regular rhythm.     Heart sounds: Normal heart sounds. No murmur.  Pulmonary:     Effort: Pulmonary effort is normal.     Breath sounds: Normal breath sounds. No rales.  Abdominal:     General: Abdomen is flat. Bowel sounds are normal. There is no distension.     Tenderness: There is no abdominal tenderness. There is no guarding or rebound.  Skin:    General: Skin is warm and dry.  Neurological:     Mental Status: He is alert and oriented to person, place, and time.        Assessment & Plan:  Paul Rich is a 59 y.o. male . SBO (small bowel obstruction) (HCC)  -Resolved.  Continue to monitor for recurrence, may need surgical follow-up if does recur.  SBO versus ileus with enteritis noted on surgical note.  Erectile dysfunction,  unspecified erectile dysfunction type - Plan: tadalafil (CIALIS) 5 MG tablet  -Tolerating Cialis without difficulty,  Rx given - use lowest effective dose. Side effects discussed (including but not limited to headache/flushing, blue discoloration of vision, possible vascular steal and risk of cardiac effects if underlying unknown coronary artery disease, and permanent sensorineural hearing loss). Understanding expressed.  Hyperlipidemia, unspecified hyperlipidemia type - Plan: pravastatin (PRAVACHOL) 20 MG tablet, omega-3 acid ethyl esters (LOVAZA) 1 g capsule  -Tolerating current regimen with Lovaza and pravastatin.  Refilled.  Has ongoing lab work with other medical group.  He plans to send me copies of those labs within a few weeks.  Additionally plans on physical in the next 6 months.  Meds ordered this encounter  Medications  . tadalafil (CIALIS) 5 MG tablet    Sig: Take 0.5-1 tablets (2.5-5 mg total) by mouth daily as needed for erectile dysfunction.    Dispense:  90 tablet    Refill:  1  . pravastatin (PRAVACHOL) 20 MG tablet    Sig: Take 1 tablet (20 mg total) by mouth daily.    Dispense:  90 tablet    Refill:  1  . omega-3 acid ethyl esters (LOVAZA) 1 g capsule    Sig: Take 2 capsules (2 g total) by mouth 2 (two) times daily.    Dispense:  360 capsule    Refill:  1   Patient Instructions    Here is a link to the CDC, which provides information on individuals who may be at increased risk of severe illness with Covid: http://huff.org/  Meds refilled today, please bring me a copy of your labs from [facility] once those are performed.  Follow-up in 6 months for physical, but please let me know if there are any questions sooner.  If any return of abdominal pain, nausea or vomiting, be  seen right away.   If you have lab work done today you will be contacted with your lab results within the next 2 weeks.  If you have not  heard from [place] then please contact us. The fastest way to get your results is to register for My Chart.   IF you received an x-ray today, you will receive an invoice from [facility]. Please contact [facility] at [phone] with questions or concerns regarding your invoice.   IF you received labwork today, you will receive an invoice from [facility]. Please contact [facility] at [phone] with questions or concerns regarding your invoice.   Our billing staff will not be able to assist you with questions regarding bills from these companies.  You will be contacted with the lab results as soon as they are available. The fastest way to get your results is to activate your My Chart account. Instructions are located on the last page of this paperwork. If you have not heard from [place] regarding the results in 2 weeks, please contact this office.        If you have lab work done today you will be contacted with your lab results within the next 2 weeks.  If you have not heard from [place] then please contact us. The fastest way to get your results is to register for My Chart.   IF you received an x-ray today, you will receive an invoice from [facility]. Please contact [facility] at [phone] with questions or concerns regarding your invoice.   IF you received labwork today, you will receive an invoice from [facility]. Please contact [facility] at [phone] with questions or concerns regarding your invoice.   Our billing staff will not be able to assist you with questions regarding bills from these companies.  You will be contacted with the lab results as soon as they are available. The fastest way to get your results is to activate your My Chart account. Instructions are located on the last page of this paperwork. If you have not heard from [place] regarding the results in 2 weeks, please contact this office.         Signed, Merri Ray, MD [facility]

## 2019-08-25 ENCOUNTER — Telehealth: Payer: Self-pay | Admitting: Family Medicine

## 2019-08-25 DIAGNOSIS — E785 Hyperlipidemia, unspecified: Secondary | ICD-10-CM

## 2019-08-25 NOTE — Telephone Encounter (Signed)
He has taken lovaza for some time without difficulty. Please advise pharmacy - ok to fill (I called, but they were closed)  Let me know if I need to speak to pharmacist. Thanks.

## 2019-08-25 NOTE — Telephone Encounter (Signed)
Pt called and stating that the pharmacy is waiting for a prescription review for Lovaza. Pt cannot have prescription refilled without it being looked into. Please advise.     [place], [place]  [place] [address]  Phone: [phone] Fax: [phone]  Not a 24 hour pharmacy; exact hours not known.

## 2019-08-25 NOTE — Telephone Encounter (Signed)
This medication was sent to the pharmacy on 08/18/19.   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   Thanks

## 2019-08-26 ENCOUNTER — Telehealth: Payer: Self-pay

## 2019-08-26 DIAGNOSIS — N529 Male erectile dysfunction, unspecified: Secondary | ICD-10-CM

## 2019-08-26 NOTE — Telephone Encounter (Signed)
Pharmacy is asking for another sig to be given to the for the med viagra. They do not suggest breaking the pills

## 2019-08-27 MED ORDER — TADALAFIL 5 MG PO TABS: 5.0000 mg | ORAL_TABLET | Freq: Every day | ORAL | 1 refills | Status: DC | PRN

## 2019-08-27 NOTE — Telephone Encounter (Signed)
I have called pt's pharmacy, and they informed me the medication needed a PA. The had no concerns about the pt being allergic to this medication.

## 2019-08-27 NOTE — Telephone Encounter (Signed)
Done. Thanks.

## 2019-08-31 DIAGNOSIS — F4323 Adjustment disorder with mixed anxiety and depressed mood: Secondary | ICD-10-CM | POA: Diagnosis not present

## 2019-09-20 DIAGNOSIS — F4323 Adjustment disorder with mixed anxiety and depressed mood: Secondary | ICD-10-CM | POA: Diagnosis not present

## 2019-09-25 DIAGNOSIS — F4323 Adjustment disorder with mixed anxiety and depressed mood: Secondary | ICD-10-CM | POA: Diagnosis not present

## 2019-10-04 DIAGNOSIS — F4323 Adjustment disorder with mixed anxiety and depressed mood: Secondary | ICD-10-CM | POA: Diagnosis not present

## 2019-10-09 DIAGNOSIS — F4323 Adjustment disorder with mixed anxiety and depressed mood: Secondary | ICD-10-CM | POA: Diagnosis not present

## 2019-10-18 DIAGNOSIS — F4323 Adjustment disorder with mixed anxiety and depressed mood: Secondary | ICD-10-CM | POA: Diagnosis not present

## 2019-10-25 DIAGNOSIS — F4323 Adjustment disorder with mixed anxiety and depressed mood: Secondary | ICD-10-CM | POA: Diagnosis not present

## 2019-11-06 DIAGNOSIS — F4323 Adjustment disorder with mixed anxiety and depressed mood: Secondary | ICD-10-CM | POA: Diagnosis not present

## 2019-11-22 DIAGNOSIS — F4323 Adjustment disorder with mixed anxiety and depressed mood: Secondary | ICD-10-CM | POA: Diagnosis not present

## 2019-11-27 DIAGNOSIS — F4323 Adjustment disorder with mixed anxiety and depressed mood: Secondary | ICD-10-CM | POA: Diagnosis not present

## 2019-12-04 DIAGNOSIS — F4323 Adjustment disorder with mixed anxiety and depressed mood: Secondary | ICD-10-CM | POA: Diagnosis not present

## 2019-12-18 DIAGNOSIS — F4323 Adjustment disorder with mixed anxiety and depressed mood: Secondary | ICD-10-CM | POA: Diagnosis not present

## 2019-12-25 DIAGNOSIS — F4323 Adjustment disorder with mixed anxiety and depressed mood: Secondary | ICD-10-CM | POA: Diagnosis not present

## 2020-01-06 DIAGNOSIS — F4323 Adjustment disorder with mixed anxiety and depressed mood: Secondary | ICD-10-CM | POA: Diagnosis not present

## 2020-01-24 ENCOUNTER — Other Ambulatory Visit: Payer: Self-pay | Admitting: Family Medicine

## 2020-01-24 DIAGNOSIS — E785 Hyperlipidemia, unspecified: Secondary | ICD-10-CM

## 2020-01-29 DIAGNOSIS — F4323 Adjustment disorder with mixed anxiety and depressed mood: Secondary | ICD-10-CM | POA: Diagnosis not present

## 2020-02-12 DIAGNOSIS — F4323 Adjustment disorder with mixed anxiety and depressed mood: Secondary | ICD-10-CM | POA: Diagnosis not present

## 2020-02-17 ENCOUNTER — Encounter: Payer: BC Managed Care – PPO | Admitting: Family Medicine

## 2020-02-21 ENCOUNTER — Other Ambulatory Visit: Payer: Self-pay | Admitting: Family Medicine

## 2020-02-21 DIAGNOSIS — E785 Hyperlipidemia, unspecified: Secondary | ICD-10-CM

## 2020-02-21 NOTE — Telephone Encounter (Signed)
Pt. Has appointment 05/25/20.

## 2020-03-11 DIAGNOSIS — F4323 Adjustment disorder with mixed anxiety and depressed mood: Secondary | ICD-10-CM | POA: Diagnosis not present

## 2020-03-23 ENCOUNTER — Other Ambulatory Visit: Payer: Self-pay | Admitting: Family Medicine

## 2020-03-23 DIAGNOSIS — E785 Hyperlipidemia, unspecified: Secondary | ICD-10-CM

## 2020-03-28 DIAGNOSIS — F4323 Adjustment disorder with mixed anxiety and depressed mood: Secondary | ICD-10-CM | POA: Diagnosis not present

## 2020-04-08 DIAGNOSIS — F4323 Adjustment disorder with mixed anxiety and depressed mood: Secondary | ICD-10-CM | POA: Diagnosis not present

## 2020-04-21 DIAGNOSIS — F4323 Adjustment disorder with mixed anxiety and depressed mood: Secondary | ICD-10-CM | POA: Diagnosis not present

## 2020-05-01 ENCOUNTER — Other Ambulatory Visit: Payer: Self-pay | Admitting: Family Medicine

## 2020-05-01 DIAGNOSIS — E785 Hyperlipidemia, unspecified: Secondary | ICD-10-CM

## 2020-05-06 DIAGNOSIS — F4323 Adjustment disorder with mixed anxiety and depressed mood: Secondary | ICD-10-CM | POA: Diagnosis not present

## 2020-05-20 DIAGNOSIS — F4323 Adjustment disorder with mixed anxiety and depressed mood: Secondary | ICD-10-CM | POA: Diagnosis not present

## 2020-05-25 ENCOUNTER — Encounter: Payer: BC Managed Care – PPO | Admitting: Family Medicine

## 2020-06-01 ENCOUNTER — Telehealth (INDEPENDENT_AMBULATORY_CARE_PROVIDER_SITE_OTHER): Payer: BC Managed Care – PPO | Admitting: Family Medicine

## 2020-06-01 ENCOUNTER — Other Ambulatory Visit: Payer: Self-pay

## 2020-06-01 ENCOUNTER — Encounter: Payer: Self-pay | Admitting: Family Medicine

## 2020-06-01 VITALS — Ht 74.0 in | Wt 218.0 lb

## 2020-06-01 DIAGNOSIS — Z0001 Encounter for general adult medical examination with abnormal findings: Secondary | ICD-10-CM | POA: Diagnosis not present

## 2020-06-01 DIAGNOSIS — Z0000 Encounter for general adult medical examination without abnormal findings: Secondary | ICD-10-CM

## 2020-06-01 DIAGNOSIS — G4733 Obstructive sleep apnea (adult) (pediatric): Secondary | ICD-10-CM

## 2020-06-01 DIAGNOSIS — N529 Male erectile dysfunction, unspecified: Secondary | ICD-10-CM | POA: Diagnosis not present

## 2020-06-01 DIAGNOSIS — E785 Hyperlipidemia, unspecified: Secondary | ICD-10-CM

## 2020-06-01 MED ORDER — TADALAFIL 5 MG PO TABS: 5.0000 mg | ORAL_TABLET | Freq: Every day | ORAL | 1 refills | Status: DC | PRN

## 2020-06-01 NOTE — Progress Notes (Signed)
Pt is sch for Jun for 6 months for med review, lab review.

## 2020-06-01 NOTE — Progress Notes (Signed)
Virtual Visit via Video Note  I connected with Paul Rich on 06/02/20 at 11:14 AM by a video enabled telemedicine application and verified that I am speaking with the correct person using two identifiers.  Patient location:home My location: home   I discussed the limitations, risks, security and privacy concerns of performing an evaluation and management service by telephone and the availability of in person appointments. I also discussed with the patient that there may be a patient responsible charge related to this service. The patient expressed understanding and agreed to proceed, consent obtained  Chief complaint: Chief Complaint  Patient presents with  . Annual Exam    Pt reports fe feels well with no issues other than having issues getting his ED medication    History of Present Illness: Paul Rich is a 59 y.o. male   Annual exam.  Reestablished primary care with me back in March. Married in September.   Hyperlipidemia: Treated with pravastatin, Lovaza, lab work with [facility]. He will send me most recent bloodwork from August: TC:174, HDL 30, LDL 92, trig 312.  AST: 40 ALT:42 [facility] has been mentioned as an option. Feels well. No changes requested at this time.   Lab Results  Component Value Date   CHOL 174 02/29/2016   HDL 33 (L) 02/29/2016   LDLCALC 92 02/29/2016   TRIG 243 (H) 02/29/2016   CHOLHDL 5.3 (H) 02/29/2016   Lab Results  Component Value Date   ALT 29 08/06/2019   AST 22 08/06/2019   ALKPHOS 41 08/06/2019   BILITOT 0.6 08/06/2019   Hypogonadism Treated by [facility].  Receives testosterone injections through that facility. HGB 16.2 in August.  Ongoing blood work with that provider.  Has used Cialis without new side effects including new headache/flushing, vision or hearing changes.  No chest pain with exertion/exercise.  Cialis as needed - needs refill.   Obstructive sleep apnea: Prior CPAP used few years ago, would take  mask off at times during night then.  Has not used recently. Not snoring recently that he knows of. Sleeping 6 hrs per night usually, but feels rested. Uses melatonin. Occasionally more hrs per night. Option of repeat sleep study discussed.   Cancer screening Colonoscopy 11/09/2015, 5-year repeat planned.  Prostate, has ongoing PSA with provider prescribing his testosterone.PSA 0.95 in August. Consistent by his reports.   Immunization History  Administered Date(s) Administered  . Influenza Split 03/04/2011, 03/03/2012, 04/04/2015  . Influenza-Unspecified 03/18/2019  . Tdap 03/23/2012  Covid vaccine: vaccinated in May/June [employer], and had [employer] booster 6-8 weeks ago.  Flu vaccine: has received in past 2 months.  Shingles vaccine: has not yet received - recommended at pharmacy.   Depression screen [facility] 2/9 06/01/2020 08/18/2019 02/29/2016 08/23/2015 05/15/2015  Decreased Interest 0 0 0 0 0  Down, Depressed, Hopeless 0 0 0 0 0  PHQ - 2 Score 0 0 0 0 0   Alcohol/tobacco. No tobacco. 3-5 alcoholic drinks per week.    Vision: due for appt in January, [facility].   Dental: [place] [facility] every 6 months.  Exercise: regular exercise - usually 5 days per week, cardio and lifting.    Patient Active Problem List   Diagnosis Date Noted  . SBO (small bowel obstruction) (HCC) 08/04/2019  . Testosterone deficiency in male 08/04/2019  . Allergic rhinitis due to other allergen 05/24/2011  . Allergy to shellfish 05/24/2011  . OSA (obstructive sleep apnea) 05/21/2011  . HEMORRHOIDS 10/12/2008  . COLONIC  POLYPS 08/29/2008  . Dyslipidemia 08/29/2008  . RECTAL BLEEDING 08/29/2008   Past Medical History:  Diagnosis Date  . Allergy    pollen  . AV block   . Childhood asthma   . Environmental allergies   . Hemorrhage of rectum and anus   . Other and unspecified hyperlipidemia   . SBO (small bowel obstruction) ([place]) 08/2019  . Testosterone deficiency in male 08/04/2019   Past Surgical  History:  Procedure Laterality Date  . HERNIA REPAIR  2012   inguinal and umbilical   Allergies  Allergen Reactions  . Shellfish Allergy Anaphylaxis and Swelling    Of swelling of eyes  . Other Hives  . Mold Extract [Trichophyton]    Prior to Admission medications   Medication Sig Start Date End Date Taking? Authorizing Provider  aspirin 81 MG tablet Take 81 mg by mouth daily.   Yes [provider]  Multiple Vitamin (MULTIVITAMIN) tablet Take 1 tablet by mouth daily.   Yes [provider]  niacinamide 500 MG tablet Take 500 mg by mouth 2 (two) times daily with a meal.   Yes [provider]  omega-3 acid ethyl esters (LOVAZA) 1 g capsule TAKE 2 CAPSULES TWICE A DAY 03/23/20  Yes Wendie Agreste, MD  ondansetron (ZOFRAN) 4 MG tablet Take 1 tablet (4 mg total) by mouth every 6 (six) hours as needed for nausea. 08/06/19  Yes Allie Bossier, MD  OVER THE COUNTER MEDICATION Take 1 capsule by mouth daily. Nitric Oxide   Yes [provider]  OVER THE COUNTER MEDICATION Take 1 capsule by mouth daily. Vitamin A & D Combination   Yes [provider]  pravastatin (PRAVACHOL) 20 MG tablet TAKE 1 TABLET DAILY 05/01/20  Yes Wendie Agreste, MD  Pregnenolone Micronized (PREGNENOLONE PO) Take 1 capsule by mouth daily.   Yes [provider]  PRESCRIPTION MEDICATION HCG injectable taking 2 ml  Two times weekly   Yes [provider]  RESVERATROL PO Take 1 capsule by mouth daily.   Yes [provider]  tadalafil (CIALIS) 5 MG tablet Take 1 tablet (5 mg total) by mouth daily as needed for erectile dysfunction. 08/27/19  Yes Wendie Agreste, MD  testosterone cypionate (DEPOTESTOSTERONE CYPIONATE) 200 MG/ML injection Inject 90 mg into the muscle every 7 (seven) days. 0.45 mL Twice a week   Yes [provider]   Social History   Socioeconomic History  . Marital status: Married    Spouse name: Not on file  . Number of  children: Not on file  . Years of education: Not on file  . Highest education level: Not on file  Occupational History  . Occupation: HR exec-business  Tobacco Use  . Smoking status: Never Smoker  . Smokeless tobacco: Never Used  Vaping Use  . Vaping Use: Never used  Substance and Sexual Activity  . Alcohol use: Yes    Alcohol/week: 3.0 - 4.0 standard drinks    Types: 3 - 4 Shots of liquor per week    Comment: occ red wine and beer but usually burbon  . Drug use: No  . Sexual activity: Yes  Other Topics Concern  . Not on file  Social History Narrative   Divorced   Exercise: Tes, 4 times a week   Education: PHD   Social Determinants of Health   Financial Resource Strain: Not on file  Food Insecurity: Not on file  Transportation Needs: Not on file  Physical Activity: Not  on file  Stress: Not on file  Social Connections: Not on file  Intimate Partner Violence: Not on file    Observations/Objective: Vitals:   06/01/20 0756  Weight: 218 lb (98.9 kg)  Height: 6\' 2"  (1.88 m)  No distress on video, appropriate responses.  Normal respiratory effort.  Euthymic mood.  All questions were answered with understanding of plan expressed   Assessment and Plan: Annual physical exam  - -anticipatory guidance as below in AVS, screening labs above. Health maintenance items as above in HPI discussed/recommended as applicable.   Erectile dysfunction, unspecified erectile dysfunction type - Plan: tadalafil (CIALIS) 5 MG tablet  -Stable with Cialis, potential side effects and risks were discussed, lowest effective dose.  Refilled.  Has continued follow-up with other provider regarding hypogonadism and testosterone treatment as well as monitoring.  Dyslipidemia  -Option of meeting with lipid specialist, but will remain on same dose of Lovaza, pravastatin for now.  OSA (obstructive sleep apnea)  -By history, asymptomatic currently, denies daytime somnolence, or known snoring.  Option of  referral for repeat sleep test to make sure he does not still have sleep apnea but deferred at this time.  Follow Up Instructions: 6 months for med review, lab review.   I discussed the assessment and treatment plan with the patient. The patient was provided an opportunity to ask questions and all were answered. The patient agreed with the plan and demonstrated an understanding of the instructions.   The patient was advised to call back or seek an in-person evaluation if the symptoms worsen or if the condition fails to improve as anticipated.  I provided 35 minutes of non-face-to-face time during this encounter.   Wendie Agreste, MD

## 2020-06-01 NOTE — Patient Instructions (Addendum)
   Good talking with you today.  No med changes recommended at this time.  Let me know if you would ever like to meet with a lipid specialist to review medication options.  I would also consider repeat sleep study at some point and can refer you without a visit if you would like.  Shingles vaccine can be given at your pharmacy.  Repeat colonoscopy appears to be due in June 2022.  Please let me know if there are questions from today's visit and take care.  If you have lab work done today you will be contacted with your lab results within the next 2 weeks.  If you have not heard from [place] then please contact us. The fastest way to get your results is to register for My Chart.   IF you received an x-ray today, you will receive an invoice from [facility]. Please contact [facility] at [phone] with questions or concerns regarding your invoice.   IF you received labwork today, you will receive an invoice from [facility]. Please contact [facility] at [phone] with questions or concerns regarding your invoice.   Our billing staff will not be able to assist you with questions regarding bills from these companies.  You will be contacted with the lab results as soon as they are available. The fastest way to get your results is to activate your My Chart account. Instructions are located on the last page of this paperwork. If you have not heard from [place] regarding the results in 2 weeks, please contact this office.

## 2020-06-10 DIAGNOSIS — F4323 Adjustment disorder with mixed anxiety and depressed mood: Secondary | ICD-10-CM | POA: Diagnosis not present

## 2020-06-12 ENCOUNTER — Telehealth: Payer: Self-pay | Admitting: Family Medicine

## 2020-06-12 ENCOUNTER — Encounter: Payer: Self-pay | Admitting: Family Medicine

## 2020-06-12 NOTE — Telephone Encounter (Signed)
Patient is calling regarding a referral that was sent by Dr. Carlota Raspberry over 10 years ago for the patient's heurnia. Patient does not remember the MD referred to Please advise CB- [phone]

## 2020-06-14 ENCOUNTER — Ambulatory Visit: Payer: BC Managed Care – PPO | Admitting: Family Medicine

## 2020-06-14 ENCOUNTER — Other Ambulatory Visit: Payer: Self-pay

## 2020-06-14 ENCOUNTER — Encounter: Payer: Self-pay | Admitting: Family Medicine

## 2020-06-14 VITALS — BP 138/88 | HR 69 | Temp 99.1°F | Ht 74.0 in | Wt 235.0 lb

## 2020-06-14 DIAGNOSIS — K458 Other specified abdominal hernia without obstruction or gangrene: Secondary | ICD-10-CM

## 2020-06-14 NOTE — Patient Instructions (Addendum)
Hernia, Adult     A hernia happens when tissue inside your body pushes out through a weak spot in your belly muscles (abdominal wall). This makes a round lump (bulge). The lump may be:  In a scar from surgery that was done in your belly (incisional hernia).  Near your belly button (umbilical hernia).  In your groin (inguinal hernia). Your groin is the area where your leg meets your lower belly (abdomen). This kind of hernia could also be: ? In your scrotum, if you are male. ? In folds of skin around your vagina, if you are male.  In your upper thigh (femoral hernia).  Inside your belly (hiatal hernia). This happens when your stomach slides above the muscle between your belly and your chest (diaphragm). If your hernia is small and it does not cause pain, you may not need treatment. If your hernia is large or it causes pain, you may need surgery. Follow these instructions at home: Activity  Avoid stretching or overusing (straining) the muscles near your hernia. Straining can happen when you: ? Lift something heavy. ? Poop (have a bowel movement).  Do not lift anything that is heavier than 10 lb (4.5 kg), or the limit that you are told, until your doctor says that it is safe.  Use the strength of your legs when you lift something heavy. Do not use only your back muscles to lift. General instructions  Do these things if told by your doctor so you do not have trouble pooping (constipation): ? Drink enough fluid to keep your pee (urine) pale yellow. ? Eat foods that are high in fiber. These include fresh fruits and vegetables, whole grains, and beans. ? Limit foods that are high in fat and processed sugars. These include foods that are fried or sweet. ? Take medicine for trouble pooping.  When you cough, try to cough gently.  You may try to push your hernia in by very gently pressing on it when you are lying down. Do not try to force the bulge back in if it will not push in  easily.  If you are overweight, work with your doctor to lose weight safely.  Do not use any products that have nicotine or tobacco in them. These include cigarettes and e-cigarettes. If you need help quitting, ask your doctor.  If you will be having surgery (hernia repair), watch your hernia for changes in shape, size, or color. Tell your doctor if you see any changes.  Take over-the-counter and prescription medicines only as told by your doctor.  Keep all follow-up visits as told by your doctor. Contact a doctor if:  You get new pain, swelling, or redness near your hernia.  You poop fewer times in a week than normal.  You have trouble pooping.  You have poop (stool) that is more dry than normal.  You have poop that is harder or larger than normal. Get help right away if:  You have a fever.  You have belly pain that gets worse.  You feel sick to your stomach (nauseous).  You throw up (vomit).  Your hernia cannot be pushed in by very gently pressing on it when you are lying down. Do not try to force the bulge back in if it will not push in easily.  Your hernia: ? Changes in shape or size. ? Changes color. ? Feels hard or it hurts when you touch it. These symptoms may represent a serious problem that is an emergency.  Do not wait to see if the symptoms will go away. Get medical help right away. Call your local emergency services (911 in the [place] Summary  A hernia happens when tissue inside your body pushes out through a weak spot in the belly muscles. This creates a bulge.  If your hernia is small and it does not hurt, you may not need treatment. If your hernia is large or it hurts, you may need surgery.  If you will be having surgery, watch your hernia for changes in shape, size, or color. Tell your doctor about any changes. This information is not intended to replace advice given to you by your health care provider. Make sure you discuss any questions you have with  your health care provider. Document Revised: 09/10/2018 Document Reviewed: 02/19/2017 Elsevier Patient Education  2021 [employer].   If you have lab work done today you will be contacted with your lab results within the next 2 weeks.  If you have not heard from [place] then please contact us. The fastest way to get your results is to register for My Chart.   IF you received an x-ray today, you will receive an invoice from [facility]. Please contact [facility] at [phone] with questions or concerns regarding your invoice.   IF you received labwork today, you will receive an invoice from [place] [facility]. Please contact [facility] at [phone] with questions or concerns regarding your invoice.   Our billing staff will not be able to assist you with questions regarding bills from these companies.  You will be contacted with the lab results as soon as they are available. The fastest way to get your results is to activate your My Chart account. Instructions are located on the last page of this paperwork. If you have not heard from [place] regarding the results in 2 weeks, please contact this office.

## 2020-06-14 NOTE — Progress Notes (Addendum)
1/12/202210:04 AM  Doristine Section 31-Jul-1960, 60 y.o., male [id number]  Chief Complaint  Patient presents with  . hernia/ referral    PT had a hernia repair a while back and wants to go back to the practice that put in the mesh to have it looked at. Pt reports a new injury due to lifting 17-180 lbs of weight (pt's wife) pt felt a tear on the L side of his abdominal area. Pt reports some protrusion in the area and thinks he may need the hernia repaired.    HPI:   Patient is a 60 y.o. male with past medical history significant for OSA, allergies, HLD who presents today for hernia.  Lift weights regularly Had hernia repair surgery in the past for surgical mesh Picked up wife and felt the tear 2 hernia repairs in the groin in his teens   Depression screen [facility] 2/9 06/14/2020 06/01/2020 08/18/2019  Decreased Interest 0 0 0  Down, Depressed, Hopeless 1 0 0  PHQ - 2 Score 1 0 0    Fall Risk  06/14/2020 06/01/2020 08/18/2019 02/29/2016 08/23/2015  Falls in the past year? 0 0 0 No No  Follow up Falls evaluation completed Falls evaluation completed Falls evaluation completed - -     Allergies  Allergen Reactions  . Shellfish Allergy Anaphylaxis and Swelling    Of swelling of eyes  . Other Hives  . Mold Extract [Trichophyton]     Prior to Admission medications   Medication Sig Start Date End Date Taking? Authorizing Provider  aspirin 81 MG tablet Take 81 mg by mouth daily.   Yes [provider]  Multiple Vitamin (MULTIVITAMIN) tablet Take 1 tablet by mouth daily.   Yes [provider]  niacinamide 500 MG tablet Take 500 mg by mouth 2 (two) times daily with a meal.   Yes [provider]  omega-3 acid ethyl esters (LOVAZA) 1 g capsule TAKE 2 CAPSULES TWICE A DAY 03/23/20  Yes Wendie Agreste, MD  ondansetron (ZOFRAN) 4 MG tablet Take 1 tablet (4 mg total) by mouth every 6 (six) hours as needed for nausea. 08/06/19  Yes Allie Bossier, MD  OVER THE COUNTER  MEDICATION Take 1 capsule by mouth daily. Nitric Oxide   Yes [provider]  OVER THE COUNTER MEDICATION Take 1 capsule by mouth daily. Vitamin A & D Combination   Yes [provider]  pravastatin (PRAVACHOL) 20 MG tablet TAKE 1 TABLET DAILY 05/01/20  Yes Wendie Agreste, MD  Pregnenolone Micronized (PREGNENOLONE PO) Take 1 capsule by mouth daily.   Yes [provider]  PRESCRIPTION MEDICATION HCG injectable taking 2 ml  Two times weekly   Yes [provider]  RESVERATROL PO Take 1 capsule by mouth daily.   Yes [provider]  tadalafil (CIALIS) 5 MG tablet Take 1 tablet (5 mg total) by mouth daily as needed for erectile dysfunction. 06/01/20  Yes Wendie Agreste, MD  testosterone cypionate (DEPOTESTOSTERONE CYPIONATE) 200 MG/ML injection Inject 90 mg into the muscle every 7 (seven) days. 0.45 mL Twice a week   Yes [provider]    Past Medical History:  Diagnosis Date  . Allergy    pollen  . AV block   . Childhood asthma   . Environmental allergies   . Hemorrhage of rectum and anus   . Other and unspecified hyperlipidemia   . SBO (small bowel obstruction) ([place]) 08/2019  . Testosterone deficiency in male 08/04/2019  Past Surgical History:  Procedure Laterality Date  . HERNIA REPAIR  2012   inguinal and umbilical    Social History   Tobacco Use  . Smoking status: Never Smoker  . Smokeless tobacco: Never Used  Substance Use Topics  . Alcohol use: Yes    Alcohol/week: 3.0 - 4.0 standard drinks    Types: 3 - 4 Shots of liquor per week    Comment: occ red wine and beer but usually burbon    Family History  Problem Relation Age of Onset  . Diabetes Mother   . Heart disease Mother   . Hyperlipidemia Mother   . Hypertension Mother   . Kidney disease Paternal Grandmother   . Lung cancer Paternal Grandmother   . Lung cancer Paternal Grandfather   . Heart disease Paternal Grandfather   . Heart disease Maternal  Grandmother   . Hyperlipidemia Maternal Grandmother   . Heart disease Maternal Grandfather   . Colon cancer Neg Hx     Review of Systems  Constitutional: Negative for chills, fever and malaise/fatigue.  Eyes: Negative for blurred vision and double vision.  Respiratory: Negative for cough, shortness of breath and wheezing.   Cardiovascular: Negative for chest pain, palpitations and leg swelling.  Gastrointestinal: Positive for abdominal pain. Negative for blood in stool, constipation, diarrhea, heartburn, nausea and vomiting.  Genitourinary: Negative for dysuria, frequency and hematuria.  Musculoskeletal: Negative for back pain and joint pain.  Skin: Negative for rash.  Neurological: Negative for dizziness, weakness and headaches.     OBJECTIVE:  Today's Vitals   06/14/20 0934  BP: 138/88  Pulse: 69  Temp: 99.1 F (37.3 C)  TempSrc: Temporal  SpO2: 97%  Weight: 235 lb (106.6 kg)  Height: 6\' 2"  (1.88 m)   Body mass index is 30.17 kg/m.   Physical Exam Vitals reviewed.  Constitutional:      Appearance: Normal appearance.  HENT:     Head: Normocephalic and atraumatic.  Eyes:     Conjunctiva/sclera: Conjunctivae normal.     Pupils: Pupils are equal, round, and reactive to light.  Cardiovascular:     Rate and Rhythm: Normal rate and regular rhythm.     Pulses: Normal pulses.     Heart sounds: Normal heart sounds. No murmur heard. No friction rub. No gallop.   Pulmonary:     Effort: Pulmonary effort is normal. No respiratory distress.     Breath sounds: Normal breath sounds. No stridor. No wheezing or rales.  Abdominal:     General: Bowel sounds are normal. There is no distension.     Palpations: Abdomen is soft. There is no mass.     Tenderness: There is no abdominal tenderness. There is no guarding or rebound.     Hernia: A hernia is present.  Musculoskeletal:     Right lower leg: No edema.     Left lower leg: No edema.  Skin:    General: Skin is warm and dry.   Neurological:     General: No focal deficit present.     Mental Status: He is alert and oriented to person, place, and time.  Psychiatric:        Mood and Affect: Mood normal.        Behavior: Behavior normal.     No results found for this or any previous visit (from the past 24 hour(s)).  No results found.   ASSESSMENT and PLAN  Problem List Items Addressed This Visit   None  Visit Diagnoses    Other specified abdominal hernia without obstruction or gangrene    -  Primary   Relevant Orders   Ambulatory referral to General Surgery   US Abdomen Complete       Return if symptoms worsen or fail to improve.    Huston Foley Terea Neubauer, FNP-BC [facility] [place], [place] [address] Ph.  [phone] Fax [phone]  I have reviewed and agree with above documentation. Agustina Caroli, MD

## 2020-06-15 NOTE — Telephone Encounter (Signed)
I have attempted to call pt and inform him that his [facility] doctor was Dr. Erskine Emery if that is who he is looking for. I am unsure if he is still practicing and I was unable to leave a voice mail.   IF he returns the call the doctor he is looking for is most likely, Gastro, Dr. Erskine Emery.

## 2020-06-22 ENCOUNTER — Telehealth: Payer: Self-pay | Admitting: Family Medicine

## 2020-06-22 DIAGNOSIS — K458 Other specified abdominal hernia without obstruction or gangrene: Secondary | ICD-10-CM

## 2020-06-22 NOTE — Telephone Encounter (Signed)
Asking for new order due to this being for a hernia imaging states needs limited order

## 2020-06-22 NOTE — Telephone Encounter (Signed)
Danae Chen with [employer] regarding order for patient. The  order is Epic is for ultrasound abdomine complete and needs to be changed to abdomine limite orderd . Because it is for hernia . Please have provider update in Epic

## 2020-06-24 DIAGNOSIS — F4323 Adjustment disorder with mixed anxiety and depressed mood: Secondary | ICD-10-CM | POA: Diagnosis not present

## 2020-07-05 ENCOUNTER — Telehealth: Payer: Self-pay | Admitting: Family Medicine

## 2020-07-05 NOTE — Telephone Encounter (Signed)
April with [facility] is needing to know if the  inquinal hernia is located at the top of stomach or the bottom of the stomach . Please call to clarify with Paul Rich    Please call [phone]

## 2020-07-05 NOTE — Telephone Encounter (Signed)
Called April back after reviewing the last AVS note I was unable to figure out if the pt's hernia was upper abdominal area or lower. Called pt to get clarification no answer left a VM for the pt to call us back to to get this information.

## 2020-07-05 NOTE — Telephone Encounter (Signed)
Pt called back regarding this and he stated its on his L side and he said it is in the middle to upper area. Please advise.

## 2020-07-06 ENCOUNTER — Ambulatory Visit
Admission: RE | Admit: 2020-07-06 | Discharge: 2020-07-06 | Disposition: A | Discharge status: Home / Self Care | Payer: BC Managed Care – PPO | Source: Ambulatory Visit | Attending: Family Medicine | Admitting: Family Medicine

## 2020-07-06 ENCOUNTER — Telehealth: Payer: Self-pay | Admitting: Family Medicine

## 2020-07-06 DIAGNOSIS — K458 Other specified abdominal hernia without obstruction or gangrene: Secondary | ICD-10-CM

## 2020-07-06 DIAGNOSIS — R1900 Intra-abdominal and pelvic swelling, mass and lump, unspecified site: Secondary | ICD-10-CM | POA: Diagnosis not present

## 2020-07-06 NOTE — Telephone Encounter (Signed)
Received call from April at [facility]. Clarification of order needed. Order says ultrasound abdomen limited left side. Please specify whether top or bottom of abdomen. Please advise April at [phone].

## 2020-07-06 NOTE — Telephone Encounter (Signed)
Called back April informed her upper Lt abdominal hernia

## 2020-07-07 DIAGNOSIS — F4323 Adjustment disorder with mixed anxiety and depressed mood: Secondary | ICD-10-CM | POA: Diagnosis not present

## 2020-07-22 DIAGNOSIS — F4323 Adjustment disorder with mixed anxiety and depressed mood: Secondary | ICD-10-CM | POA: Diagnosis not present

## 2020-07-28 ENCOUNTER — Other Ambulatory Visit: Payer: Self-pay

## 2020-07-29 DIAGNOSIS — F4323 Adjustment disorder with mixed anxiety and depressed mood: Secondary | ICD-10-CM | POA: Diagnosis not present

## 2020-07-30 DIAGNOSIS — F4323 Adjustment disorder with mixed anxiety and depressed mood: Secondary | ICD-10-CM | POA: Diagnosis not present

## 2020-08-02 DIAGNOSIS — R1084 Generalized abdominal pain: Secondary | ICD-10-CM | POA: Diagnosis not present

## 2020-08-03 ENCOUNTER — Other Ambulatory Visit: Payer: Self-pay | Admitting: Surgery

## 2020-08-03 DIAGNOSIS — R109 Unspecified abdominal pain: Secondary | ICD-10-CM

## 2020-08-12 DIAGNOSIS — F4323 Adjustment disorder with mixed anxiety and depressed mood: Secondary | ICD-10-CM | POA: Diagnosis not present

## 2020-08-18 ENCOUNTER — Ambulatory Visit
Admission: RE | Admit: 2020-08-18 | Discharge: 2020-08-18 | Disposition: A | Discharge status: Home / Self Care | Payer: BC Managed Care – PPO | Source: Ambulatory Visit | Attending: Surgery | Admitting: Surgery

## 2020-08-18 DIAGNOSIS — I700 Atherosclerosis of aorta: Secondary | ICD-10-CM | POA: Diagnosis not present

## 2020-08-18 DIAGNOSIS — K4020 Bilateral inguinal hernia, without obstruction or gangrene, not specified as recurrent: Secondary | ICD-10-CM | POA: Diagnosis not present

## 2020-08-18 DIAGNOSIS — R109 Unspecified abdominal pain: Secondary | ICD-10-CM

## 2020-08-18 DIAGNOSIS — M4319 Spondylolisthesis, multiple sites in spine: Secondary | ICD-10-CM | POA: Diagnosis not present

## 2020-08-18 MED ORDER — IOPAMIDOL (ISOVUE-300) INJECTION 61%
100.0000 mL | Freq: Once | INTRAVENOUS | Status: AC | PRN
  Administered 2020-08-18: 100 mL via INTRAVENOUS

## 2020-08-19 DIAGNOSIS — F4323 Adjustment disorder with mixed anxiety and depressed mood: Secondary | ICD-10-CM | POA: Diagnosis not present

## 2020-09-09 DIAGNOSIS — F4323 Adjustment disorder with mixed anxiety and depressed mood: Secondary | ICD-10-CM | POA: Diagnosis not present

## 2020-09-23 DIAGNOSIS — F4323 Adjustment disorder with mixed anxiety and depressed mood: Secondary | ICD-10-CM | POA: Diagnosis not present

## 2020-10-07 DIAGNOSIS — F4323 Adjustment disorder with mixed anxiety and depressed mood: Secondary | ICD-10-CM | POA: Diagnosis not present

## 2020-11-25 DIAGNOSIS — F4323 Adjustment disorder with mixed anxiety and depressed mood: Secondary | ICD-10-CM | POA: Diagnosis not present

## 2020-11-30 ENCOUNTER — Other Ambulatory Visit: Payer: Self-pay

## 2020-11-30 ENCOUNTER — Encounter: Payer: Self-pay | Admitting: Family Medicine

## 2020-11-30 ENCOUNTER — Ambulatory Visit: Payer: BC Managed Care – PPO | Admitting: Family Medicine

## 2020-11-30 VITALS — BP 122/78 | HR 63 | Temp 98.0°F | Resp 16 | Wt 225.8 lb

## 2020-11-30 DIAGNOSIS — K219 Gastro-esophageal reflux disease without esophagitis: Secondary | ICD-10-CM

## 2020-11-30 DIAGNOSIS — Z23 Encounter for immunization: Secondary | ICD-10-CM | POA: Diagnosis not present

## 2020-11-30 DIAGNOSIS — E785 Hyperlipidemia, unspecified: Secondary | ICD-10-CM

## 2020-11-30 DIAGNOSIS — N529 Male erectile dysfunction, unspecified: Secondary | ICD-10-CM

## 2020-11-30 MED ORDER — PRAVASTATIN SODIUM 20 MG PO TABS: 20.0000 mg | ORAL_TABLET | Freq: Every day | ORAL | 2 refills | Status: DC

## 2020-11-30 MED ORDER — TADALAFIL 5 MG PO TABS: 5.0000 mg | ORAL_TABLET | Freq: Every day | ORAL | 1 refills | Status: DC | PRN

## 2020-11-30 MED ORDER — OMEGA-3-ACID ETHYL ESTERS 1 G PO CAPS: 2.0000 | ORAL_CAPSULE | Freq: Two times a day (BID) | ORAL | 3 refills | Status: DC

## 2020-11-30 NOTE — Progress Notes (Signed)
Subjective:  Patient ID: Paul Rich, male    DOB: 07/20/1960  Age: 60 y.o. MRN: [id number]  CC:  Chief Complaint  Patient presents with   Hyperlipidemia   Gastroesophageal Reflux    Experiencing over the past few weeks, states he has not tried any OTC medication for relief    Health Maintenance    Needing to schedule colonoscopy - has been 5 years Will send in COVID vaccine dates via mychart Wanting to discuss getting Shingrix today     HPI Paul Rich presents for   Hyperlipidemia: Pravastatin 20 mg daily, Lovaza 2 g twice daily.  Blood work recently performed May 31 through the [facility].   Hemoglobin 20.2.  Donated blood, thought to be somewhat volume depleted, plan for repeat 1 month.  Basic metabolic panel was normal including glucose 100.  TSH normal at 1.114.  PSA normal 0.710, compared to 0.800 in January 2022.  Testosterone 838, SHBG 24 Free testosterone 21.3.  Previous lipid panel 01/27/2020 total cholesterol 174, triglyceride 312, HDL 30, LDL 92.  LFTs at that time ALT 42, AST borderline elevated at 40. Not fasting today.    Lab Results  Component Value Date   CHOL 174 02/29/2016   HDL 33 (L) 02/29/2016   LDLCALC 92 02/29/2016   TRIG 243 (H) 02/29/2016   CHOLHDL 5.3 (H) 02/29/2016   Lab Results  Component Value Date   ALT 29 08/06/2019   AST 22 08/06/2019   ALKPHOS 41 08/06/2019   BILITOT 0.6 08/06/2019   Heartburn/GERD Past few weeks. Has treated with tums. Noted every few days. Notes with more caffeine. Coffee, diet Dew. No diet changes. Some increased stress. Prevacid once worked well.  No n/v/weight loss/bowel changes or blood in stool. No night sweats.  Less alcohol.   Hypergonadism treated by [facility].   Health maintenance: Shingrix today, plans on scheduling colonoscopy as due.   5-year repeat planned from June 2017.  Erectile dysfunction: [place]ilais works well, no vision/hearing changes. No CP with  exercise.  History Patient Active Problem List   Diagnosis Date Noted   SBO (small bowel obstruction) ([place]) 08/04/2019   Testosterone deficiency in male 08/04/2019   Allergic rhinitis due to other allergen 05/24/2011   Allergy to shellfish 05/24/2011   OSA (obstructive sleep apnea) 05/21/2011   HEMORRHOIDS 10/12/2008   COLONIC POLYPS 08/29/2008   Dyslipidemia 08/29/2008   RECTAL BLEEDING 08/29/2008   Past Medical History:  Diagnosis Date   Allergy    pollen   AV block    Childhood asthma    Environmental allergies    Hemorrhage of rectum and anus    Other and unspecified hyperlipidemia    SBO (small bowel obstruction) ([place]) 08/2019   Testosterone deficiency in male 08/04/2019   Past Surgical History:  Procedure Laterality Date   HERNIA REPAIR  2012   inguinal and umbilical   Allergies  Allergen Reactions   Shellfish Allergy Anaphylaxis and Swelling    Of swelling of eyes   Other Hives   Mold Extract [[place]]    Prior to Admission medications   Medication Sig Start Date End Date Taking? Authorizing Provider  aspirin 81 MG tablet Take 81 mg by mouth daily.   Yes [provider]  Multiple Vitamin (MULTIVITAMIN) tablet Take 1 tablet by mouth daily.   Yes [provider]  niacinamide 500 MG tablet Take 500 mg by mouth 2 (two) times daily with a meal.   Yes [provider]  omega-3 acid ethyl esters (LOVAZA) 1 g capsule TAKE 2 CAPSULES TWICE A DAY 03/23/20  Yes Wendie Agreste, MD  ondansetron (ZOFRAN) 4 MG tablet Take 1 tablet (4 mg total) by mouth every 6 (six) hours as needed for nausea. 08/06/19  Yes Allie Bossier, MD  OVER THE COUNTER MEDICATION Take 1 capsule by mouth daily. Nitric Oxide   Yes [provider]  OVER THE COUNTER MEDICATION Take 1 capsule by mouth daily. Vitamin A & D Combination   Yes [provider]  pravastatin (PRAVACHOL) 20 MG tablet TAKE 1 TABLET DAILY 05/01/20  Yes Wendie Agreste, MD   Pregnenolone Micronized (PREGNENOLONE PO) Take 1 capsule by mouth daily.   Yes [provider]  PRESCRIPTION MEDICATION HCG injectable taking 2 ml  Two times weekly   Yes [provider]  RESVERATROL PO Take 1 capsule by mouth daily.   Yes [provider]  tadalafil (CIALIS) 5 MG tablet Take 1 tablet (5 mg total) by mouth daily as needed for erectile dysfunction. 06/01/20  Yes Wendie Agreste, MD  testosterone cypionate (DEPOTESTOSTERONE CYPIONATE) 200 MG/ML injection Inject 90 mg into the muscle every 7 (seven) days. 0.45 mL Twice a week   Yes [provider]   Social History   Socioeconomic History   Marital status: Married    Spouse name: Not on file   Number of children: Not on file   Years of education: Not on file   Highest education level: Not on file  Occupational History   Occupation: HR exec-business  Tobacco Use   Smoking status: Never   Smokeless tobacco: Never  Vaping Use   Vaping Use: Never used  Substance and Sexual Activity   Alcohol use: Yes    Alcohol/week: 3.0 - 4.0 standard drinks    Types: 3 - 4 Shots of liquor per week    Comment: occ red wine and beer but usually burbon   Drug use: No   Sexual activity: Yes  Other Topics Concern   Not on file  Social History Narrative   Divorced   Exercise: Tes, 4 times a week   Education: PHD   Social Determinants of Radio broadcast assistant Strain: Not on file  Food Insecurity: Not on file  Transportation Needs: Not on file  Physical Activity: Not on file  Stress: Not on file  Social Connections: Not on file  Intimate Partner Violence: Not on file    Review of Systems  Constitutional:  Negative for fatigue and unexpected weight change.  Eyes:  Negative for visual disturbance.  Respiratory:  Negative for cough, chest tightness and shortness of breath.   Cardiovascular:  Negative for chest pain, palpitations and leg swelling.  Gastrointestinal:  Negative for abdominal  pain and blood in stool.  Neurological:  Negative for dizziness, light-headedness and headaches.  Per HPI.   Objective:   Vitals:   11/30/20 0842  BP: 122/78  Pulse: 63  Resp: 16  Temp: 98 F (36.7 C)  TempSrc: Temporal  SpO2: 96%  Weight: 225 lb 12.8 oz (102.4 kg)     Physical Exam Vitals reviewed.  Constitutional:      Appearance: He is well-developed.  HENT:     Head: Normocephalic and atraumatic.  Neck:     Vascular: No carotid bruit or JVD.  Cardiovascular:     Rate and Rhythm: Normal rate and regular rhythm.     Heart sounds: Normal heart sounds. No murmur heard. Pulmonary:  Effort: Pulmonary effort is normal.     Breath sounds: Normal breath sounds. No rales.  Abdominal:     General: There is no distension.     Palpations: There is no mass.     Tenderness: There is no abdominal tenderness. There is no guarding or rebound.  Musculoskeletal:     Right lower leg: No edema.     Left lower leg: No edema.  Skin:    General: Skin is warm and dry.  Neurological:     Mental Status: He is alert and oriented to person, place, and time.  Psychiatric:        Mood and Affect: Mood normal.       Assessment & Plan:  Paul Rich is a 60 y.o. male . Dyslipidemia - Plan: Hepatic function panel, Lipid panel Hyperlipidemia, unspecified hyperlipidemia type - Plan: pravastatin (PRAVACHOL) 20 MG tablet, omega-3 acid ethyl esters (LOVAZA) 1 g capsule  -  Stable, tolerating current regimen. Medications refilled. Labs pending as above.   -Has planned follow-up for elevated CBC with other provider.  Plan for fasting lab only visit.  Erectile dysfunction, unspecified erectile dysfunction type - Plan: tadalafil (CIALIS) 5 MG tablet  -  Stable, tolerating current regimen. Medications refilled.   Need for shingles vaccine - Plan: Varicella-zoster vaccine IM Given, repeat 6 months  GERD Trigger avoidance discussed, over-the-counter H2 blocker or PPI if needed with RTC  precautions.  No red flags on exam/history at this time.  Meds ordered this encounter  Medications   pravastatin (PRAVACHOL) 20 MG tablet    Sig: Take 1 tablet (20 mg total) by mouth daily.    Dispense:  90 tablet    Refill:  2   omega-3 acid ethyl esters (LOVAZA) 1 g capsule    Sig: Take 2 capsules (2 g total) by mouth 2 (two) times daily.    Dispense:  360 capsule    Refill:  3   tadalafil (CIALIS) 5 MG tablet    Sig: Take 1 tablet (5 mg total) by mouth daily as needed for erectile dysfunction.    Dispense:  90 tablet    Refill:  1   Patient Instructions  Try to avoid trigger foods. Caffeine especially.  Pepcid over the counter is ok, change to prilosec if pepcid not effective.  Recheck in next 6 weeks if not improved. Sooner if worse.   Call Dr. Havery Moros [phone] to schedule colonoscopy.   Lab visit for fasting labs in next 2 weeks.    Food Choices for Gastroesophageal Reflux Disease, Adult When you have gastroesophageal reflux disease (GERD), the foods you eat and your eating habits are very important. Choosing the right foods can help ease the discomfort of GERD. Consider working with a dietitian to help you [employer] choices. What are tips for following this plan? Reading food labels Look for foods that are low in saturated fat. Foods that have less than 5% of daily value (DV) of fat and 0 g of trans fats may help with your symptoms. Cooking Cook foods using methods other than frying. This may include baking, steaming, grilling, or broiling. These are all methods that do not need a lot of fat for cooking. To add flavor, try to use herbs that are low in spice and acidity. Meal planning  Choose healthy foods that are low in fat, such as fruits, vegetables, whole grains, low-fat dairy products, lean meats, fish, and poultry. Eat frequent, small meals instead of three large meals each  day. Eat your meals slowly, in a relaxed setting. Avoid bending over or lying  down until 2-3 hours after eating. Limit high-fat foods such as fatty meats or fried foods. Limit your intake of fatty foods, such as oils, butter, and shortening. Avoid the following as told by your health care provider: Foods that cause symptoms. These may be different for different people. Keep a food diary to keep track of foods that cause symptoms. Alcohol. Drinking large amounts of liquid with meals. Eating meals during the 2-3 hours before bed.  Lifestyle Maintain a healthy weight. Ask your health care provider what weight is healthy for you. If you need to lose weight, work with your health care provider to do so safely. Exercise for at least 30 minutes on 5 or more days each week, or as told by your health care provider. Avoid wearing clothes that fit tightly around your waist and chest. Do not use any products that contain nicotine or tobacco. These products include cigarettes, chewing tobacco, and vaping devices, such as e-cigarettes. If you need help quitting, ask your health care provider. Sleep with the head of your bed raised. Use a wedge under the mattress or blocks under the bed frame to raise the head of the bed. Chew sugar-free gum after mealtimes. What foods should I eat?  Eat a healthy, well-balanced diet of fruits, vegetables, whole grains, low-fat dairy products, lean meats, fish, and poultry. Each person is different. Foods that may trigger symptoms in one person may not trigger any symptoms in another person. Work with your health care provider to identify foods that are safe foryou. The items listed above may not be a complete list of recommended foods and beverages. Contact a dietitian for more information. What foods should I avoid? Limiting some of these foods may help manage the symptoms of GERD. Everyone is different. Consult a dietitian or your health care provider to help youidentify the exact foods to avoid, if any. Fruits Any fruits prepared with added fat.  Any fruits that cause symptoms. For some people this may include citrus fruits, such as oranges, grapefruit, pineapple,and lemons. Vegetables Deep-fried vegetables. [place] fries. Any vegetables prepared with added fat. Any vegetables that cause symptoms. For some people, this may include tomatoesand tomato products, chili peppers, onions and garlic, and horseradish. Grains Pastries or quick breads with added fat. Meats and other proteins High-fat meats, such as fatty beef or pork, hot dogs, ribs, ham, sausage, salami, and bacon. Fried meat or protein, including fried fish and friedchicken. Nuts and nut butters, in large amounts. Dairy Whole milk and chocolate milk. Sour cream. Cream. Ice cream. Cream cheese.Milkshakes. Fats and oils Butter. Margarine. Shortening. Ghee. Beverages Coffee and tea, with or without caffeine. Carbonated beverages. Sodas. Energy drinks. Fruit juice made with acidic fruits, such as orange or grapefruit.Tomato juice. Alcoholic drinks. Sweets and desserts Chocolate and cocoa. Donuts. Seasonings and condiments Pepper. Peppermint and spearmint. Added salt. Any condiments, herbs, or seasonings that cause symptoms. For some people, this may include curry, hotsauce, or vinegar-based salad dressings. The items listed above may not be a complete list of foods and beverages to avoid. Contact a dietitian for more information. Questions to ask your health care provider Diet and lifestyle changes are usually the first steps that are taken to manage symptoms of GERD. If diet and lifestyle changes do not improve your symptoms,talk with your health care provider about taking medicines. Where to find more information International Foundation for Gastrointestinal Disorders: aboutgerd.org Summary When  you have gastroesophageal reflux disease (GERD), food and lifestyle choices may be very helpful in easing the discomfort of GERD. Eat frequent, small meals instead of three large meals  each day. Eat your meals slowly, in a relaxed setting. Avoid bending over or lying down until 2-3 hours after eating. Limit high-fat foods such as fatty meats or fried foods. This information is not intended to replace advice given to you by your health care provider. Make sure you discuss any questions you have with your healthcare provider. Document Revised: 11/29/2019 Document Reviewed: 11/29/2019 Elsevier Patient Education  2022 [place],   Merri Ray, MD [place] 11/30/20 9:37 AM

## 2020-11-30 NOTE — Patient Instructions (Addendum)
Try to avoid trigger foods. Caffeine especially.  Pepcid over the counter is ok, change to prilosec if pepcid not effective.  Recheck in next 6 weeks if not improved. Sooner if worse.   Call Dr. Havery Moros [phone] to schedule colonoscopy.   Lab visit for fasting labs in next 2 weeks.    Food Choices for Gastroesophageal Reflux Disease, Adult When you have gastroesophageal reflux disease (GERD), the foods you eat and your eating habits are very important. Choosing the right foods can help ease the discomfort of GERD. Consider working with a dietitian to help you [employer] choices. What are tips for following this plan? Reading food labels Look for foods that are low in saturated fat. Foods that have less than 5% of daily value (DV) of fat and 0 g of trans fats may help with your symptoms. Cooking Cook foods using methods other than frying. This may include baking, steaming, grilling, or broiling. These are all methods that do not need a lot of fat for cooking. To add flavor, try to use herbs that are low in spice and acidity. Meal planning  Choose healthy foods that are low in fat, such as fruits, vegetables, whole grains, low-fat dairy products, lean meats, fish, and poultry. Eat frequent, small meals instead of three large meals each day. Eat your meals slowly, in a relaxed setting. Avoid bending over or lying down until 2-3 hours after eating. Limit high-fat foods such as fatty meats or fried foods. Limit your intake of fatty foods, such as oils, butter, and shortening. Avoid the following as told by your health care provider: Foods that cause symptoms. These may be different for different people. Keep a food diary to keep track of foods that cause symptoms. Alcohol. Drinking large amounts of liquid with meals. Eating meals during the 2-3 hours before bed.  Lifestyle Maintain a healthy weight. Ask your health care provider what weight is healthy for you. If you need to lose  weight, work with your health care provider to do so safely. Exercise for at least 30 minutes on 5 or more days each week, or as told by your health care provider. Avoid wearing clothes that fit tightly around your waist and chest. Do not use any products that contain nicotine or tobacco. These products include cigarettes, chewing tobacco, and vaping devices, such as e-cigarettes. If you need help quitting, ask your health care provider. Sleep with the head of your bed raised. Use a wedge under the mattress or blocks under the bed frame to raise the head of the bed. Chew sugar-free gum after mealtimes. What foods should I eat?  Eat a healthy, well-balanced diet of fruits, vegetables, whole grains, low-fat dairy products, lean meats, fish, and poultry. Each person is different. Foods that may trigger symptoms in one person may not trigger any symptoms in another person. Work with your health care provider to identify foods that are safe foryou. The items listed above may not be a complete list of recommended foods and beverages. Contact a dietitian for more information. What foods should I avoid? Limiting some of these foods may help manage the symptoms of GERD. Everyone is different. Consult a dietitian or your health care provider to help youidentify the exact foods to avoid, if any. Fruits Any fruits prepared with added fat. Any fruits that cause symptoms. For some people this may include citrus fruits, such as oranges, grapefruit, pineapple,and lemons. Vegetables Deep-fried vegetables. [place] fries. Any vegetables prepared with added fat. Any vegetables  that cause symptoms. For some people, this may include tomatoesand tomato products, chili peppers, onions and garlic, and horseradish. Grains Pastries or quick breads with added fat. Meats and other proteins High-fat meats, such as fatty beef or pork, hot dogs, ribs, ham, sausage, salami, and bacon. Fried meat or protein, including fried fish  and friedchicken. Nuts and nut butters, in large amounts. Dairy Whole milk and chocolate milk. Sour cream. Cream. Ice cream. Cream cheese.Milkshakes. Fats and oils Butter. Margarine. Shortening. Ghee. Beverages Coffee and tea, with or without caffeine. Carbonated beverages. Sodas. Energy drinks. Fruit juice made with acidic fruits, such as orange or grapefruit.Tomato juice. Alcoholic drinks. Sweets and desserts Chocolate and cocoa. Donuts. Seasonings and condiments Pepper. Peppermint and spearmint. Added salt. Any condiments, herbs, or seasonings that cause symptoms. For some people, this may include curry, hotsauce, or vinegar-based salad dressings. The items listed above may not be a complete list of foods and beverages to avoid. Contact a dietitian for more information. Questions to ask your health care provider Diet and lifestyle changes are usually the first steps that are taken to manage symptoms of GERD. If diet and lifestyle changes do not improve your symptoms,talk with your health care provider about taking medicines. Where to find more information International Foundation for Gastrointestinal Disorders: aboutgerd.org Summary When you have gastroesophageal reflux disease (GERD), food and lifestyle choices may be very helpful in easing the discomfort of GERD. Eat frequent, small meals instead of three large meals each day. Eat your meals slowly, in a relaxed setting. Avoid bending over or lying down until 2-3 hours after eating. Limit high-fat foods such as fatty meats or fried foods. This information is not intended to replace advice given to you by your health care provider. Make sure you discuss any questions you have with your healthcare provider. Document Revised: 11/29/2019 Document Reviewed: 11/29/2019 Elsevier Patient Education  [place].

## 2020-12-09 DIAGNOSIS — F4323 Adjustment disorder with mixed anxiety and depressed mood: Secondary | ICD-10-CM | POA: Diagnosis not present

## 2020-12-19 ENCOUNTER — Other Ambulatory Visit (INDEPENDENT_AMBULATORY_CARE_PROVIDER_SITE_OTHER): Payer: BC Managed Care – PPO

## 2020-12-19 ENCOUNTER — Other Ambulatory Visit: Payer: Self-pay

## 2020-12-19 DIAGNOSIS — E785 Hyperlipidemia, unspecified: Secondary | ICD-10-CM | POA: Diagnosis not present

## 2020-12-19 LAB — HEPATIC FUNCTION PANEL
ALT: 37 U/L (ref 0–53)
AST: 27 U/L (ref 0–37)
Albumin: 4.5 g/dL (ref 3.5–5.2)
Alkaline Phosphatase: 47 U/L (ref 39–117)
Bilirubin, Direct: 0.1 mg/dL (ref 0.0–0.3)
Total Bilirubin: 1 mg/dL (ref 0.2–1.2)
Total Protein: 7.1 g/dL (ref 6.0–8.3)

## 2020-12-19 LAB — LIPID PANEL
Cholesterol: 215 mg/dL — ABNORMAL HIGH (ref 0–200)
HDL: 33.3 mg/dL — ABNORMAL LOW (ref >39.00)
Total CHOL/HDL Ratio: 6
Triglycerides: 464 mg/dL — ABNORMAL HIGH (ref 0.0–149.0)

## 2020-12-19 LAB — LDL CHOLESTEROL, DIRECT: Direct LDL: 101 mg/dL

## 2021-01-02 DIAGNOSIS — F4323 Adjustment disorder with mixed anxiety and depressed mood: Secondary | ICD-10-CM | POA: Diagnosis not present

## 2021-01-20 DIAGNOSIS — F4323 Adjustment disorder with mixed anxiety and depressed mood: Secondary | ICD-10-CM | POA: Diagnosis not present

## 2021-02-03 DIAGNOSIS — F4323 Adjustment disorder with mixed anxiety and depressed mood: Secondary | ICD-10-CM | POA: Diagnosis not present

## 2021-02-07 ENCOUNTER — Encounter: Payer: Self-pay | Admitting: Gastroenterology

## 2021-03-02 IMAGING — DX DG ABD PORTABLE 1V
1 series · 1 of 1 positions shown · non-contrast
Comparison: CT of the abdomen and pelvis 08/04/2019

CLINICAL DATA: NG tube placement. Small bowel obstruction.

EXAM:
PORTABLE ABDOMEN - 1 VIEW

[abdomen]
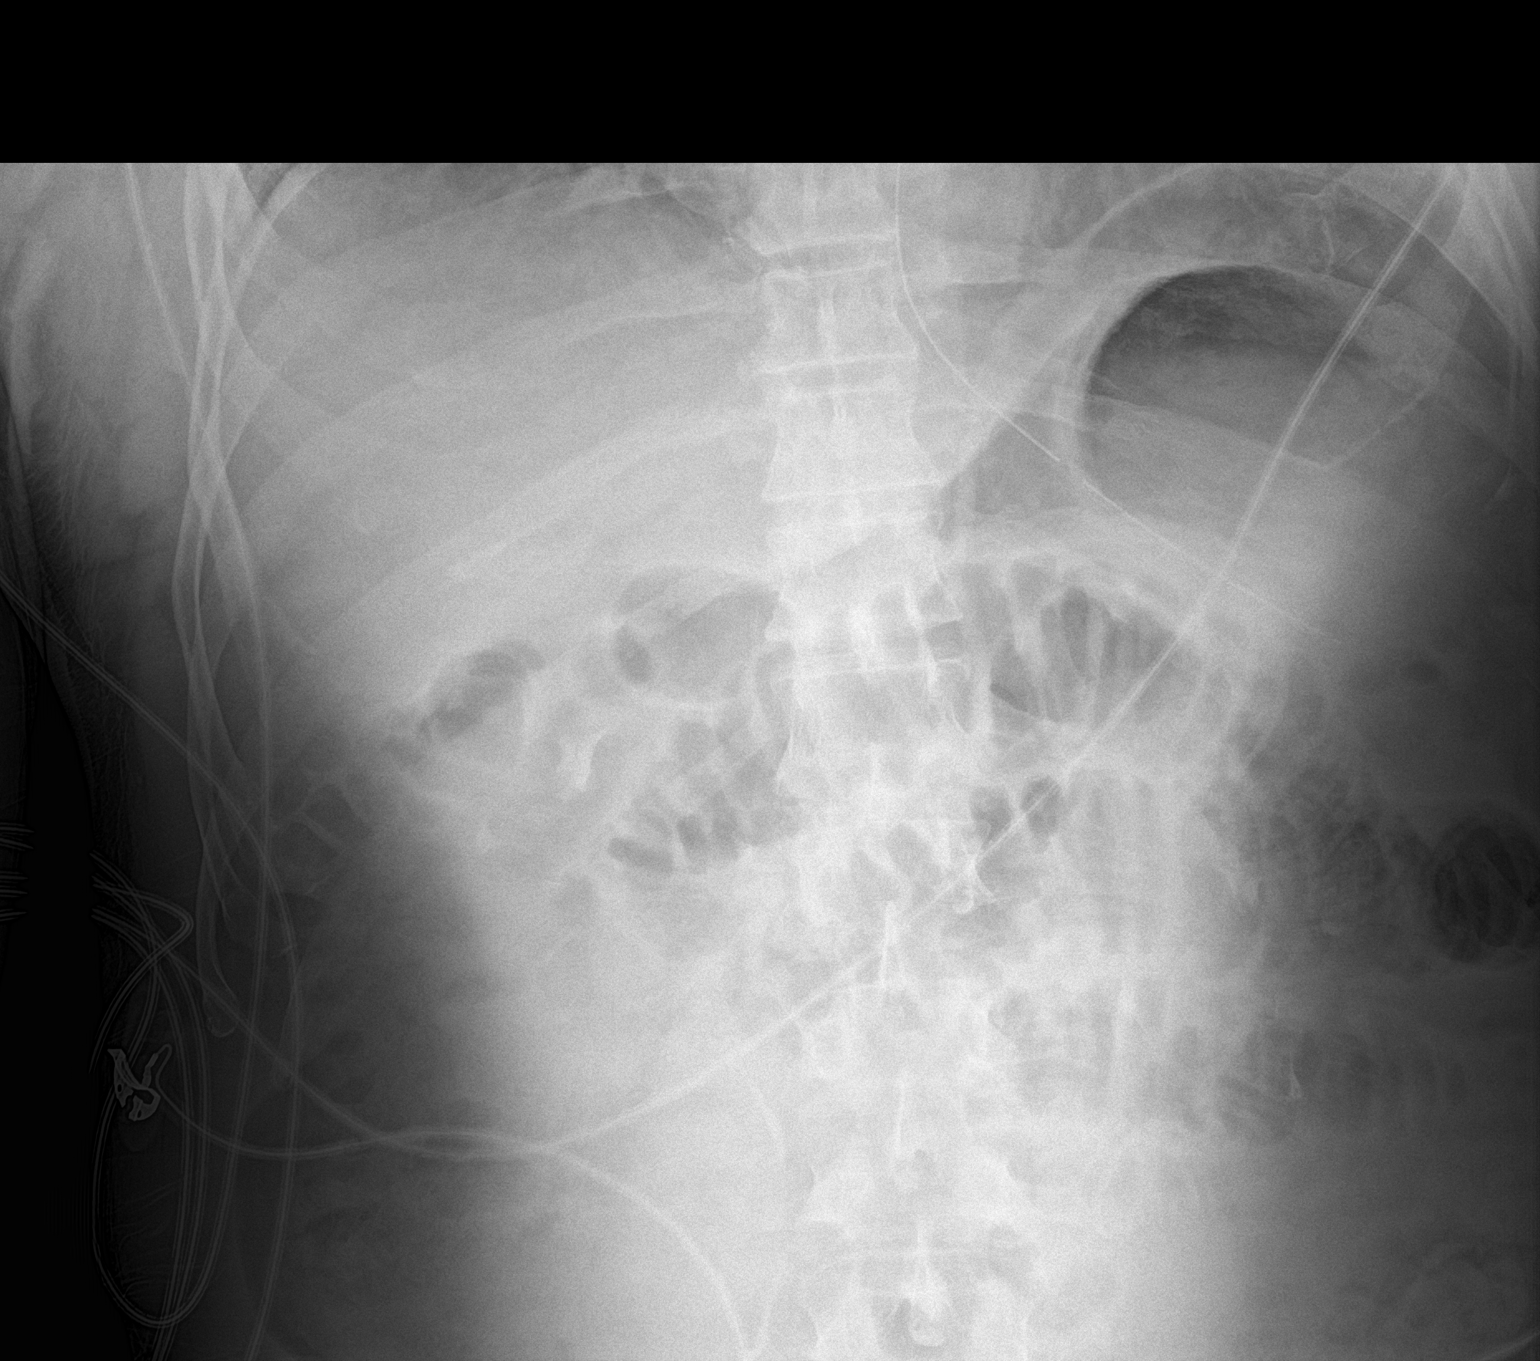

[1 of 1 positions shown; findings below may reference images not displayed]

FINDINGS: Side port of the NG tube is in the stomach. Lung bases are clear.
Dilated loops of small bowel are similar the prior exam. No free air
is present.
IMPRESSION: Side port of the NG tube is in the stomach.

## 2021-03-02 IMAGING — CT CT ABD-PELV W/ CM
2 of 5 series · 15 of 46 positions shown, 17 images · IV contrast (APPLIED)
Comparison: None.

CLINICAL DATA: Lower abdominal pain, reflux

EXAM:
CT ABDOMEN AND PELVIS WITH CONTRAST
TECHNIQUE: Multidetector CT imaging of the abdomen and pelvis was performed
using the standard protocol following bolus administration of
intravenous contrast.
CONTRAST:  100mL OMNIPAQUE IOHEXOL 300 MG/ML  SOLN

[Series 3: abdomen 5.0 · axial · 0.91mm/px · z∈[+665,+1110]mm · 12 of 103 slices shown, 14 images]
[im 7/103  soft-tissue]
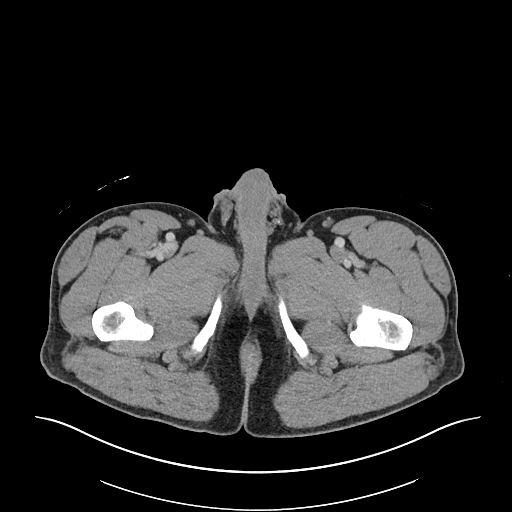
[im 7/103  bone]
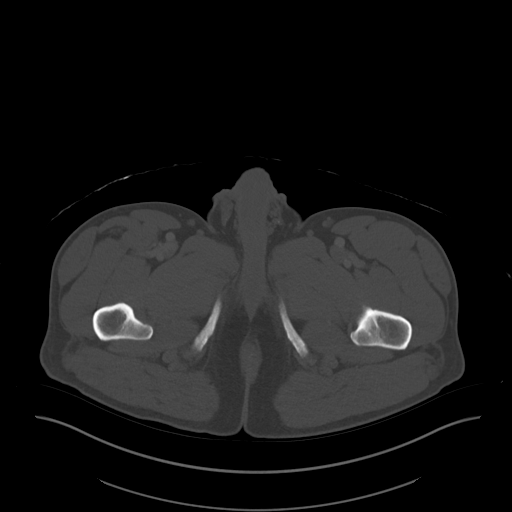
[im 13/103  soft-tissue]
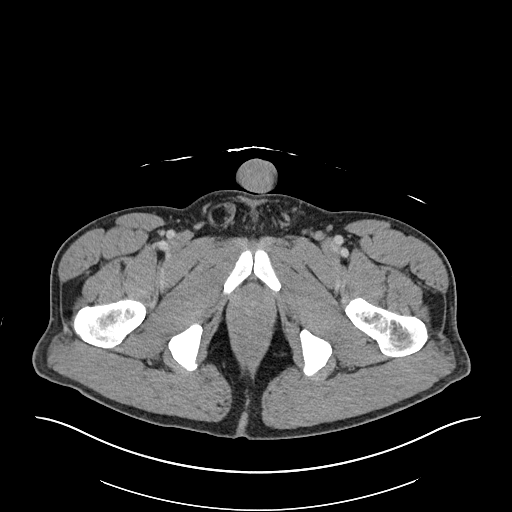
[im 26/103  soft-tissue]
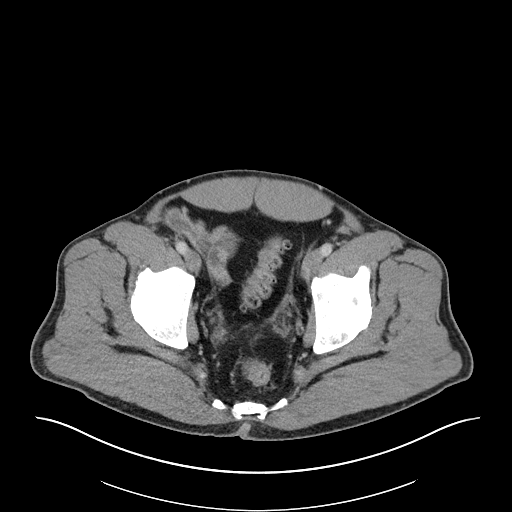
[im 32/103  soft-tissue]
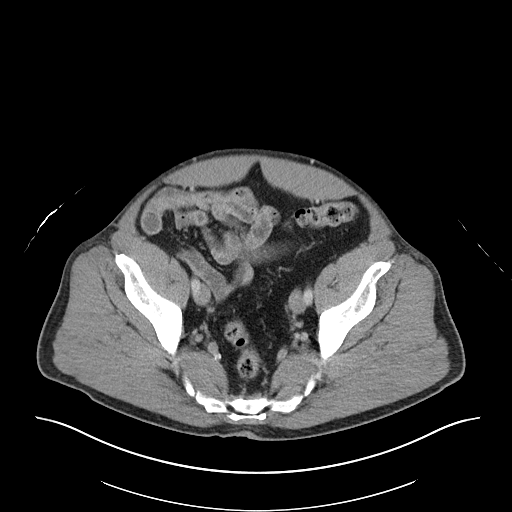
[im 39/103  soft-tissue]
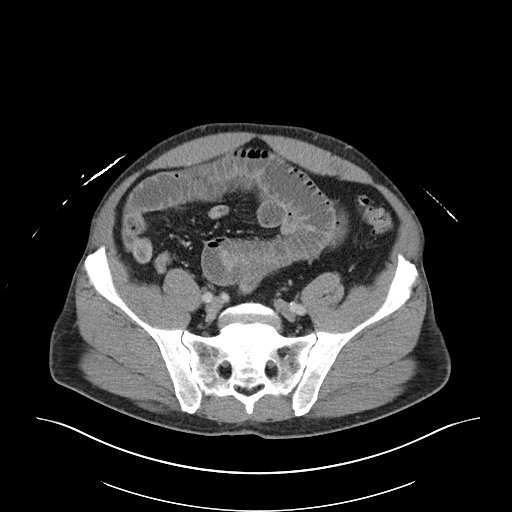
[im 45/103  soft-tissue]
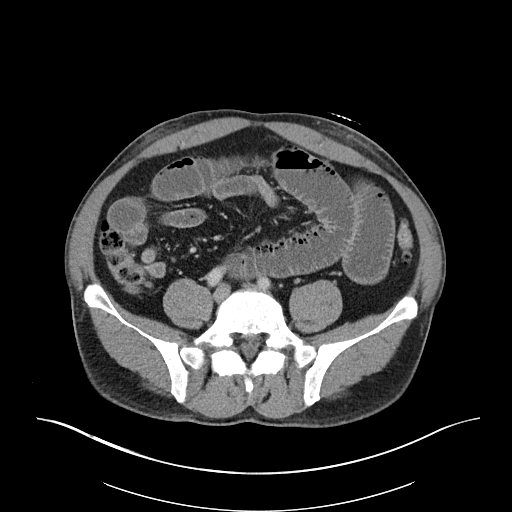
[im 58/103  soft-tissue]
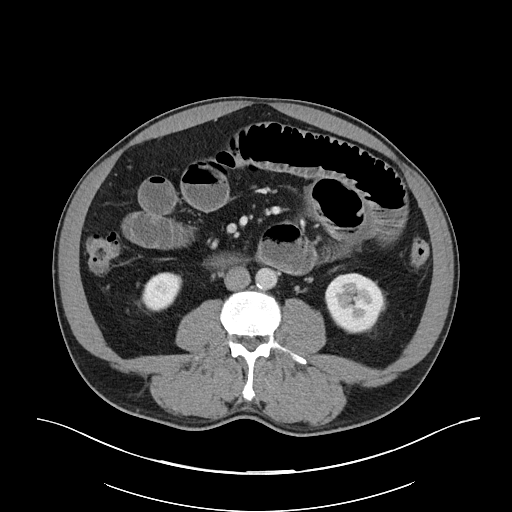
[im 64/103  soft-tissue]
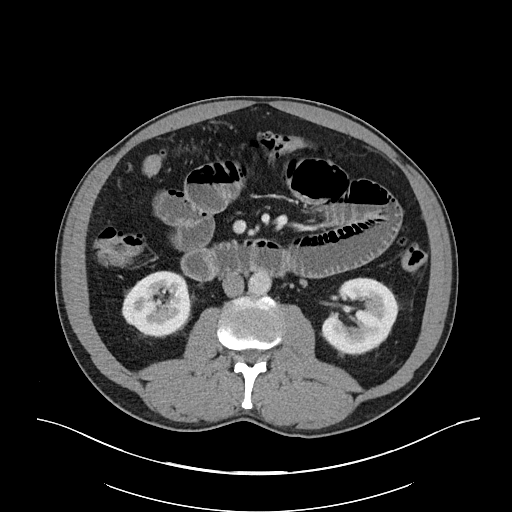
[im 71/103  soft-tissue]
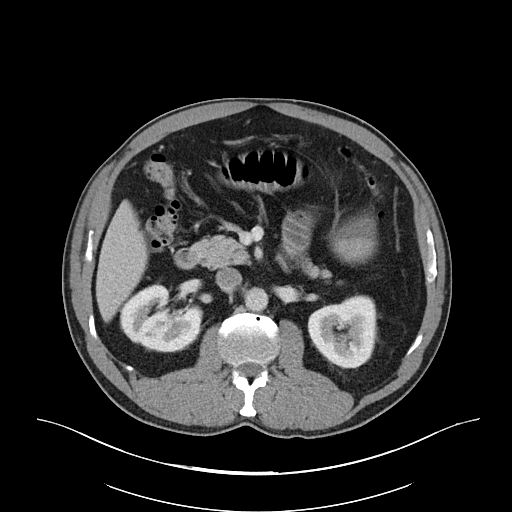
[im 71/103  bone]
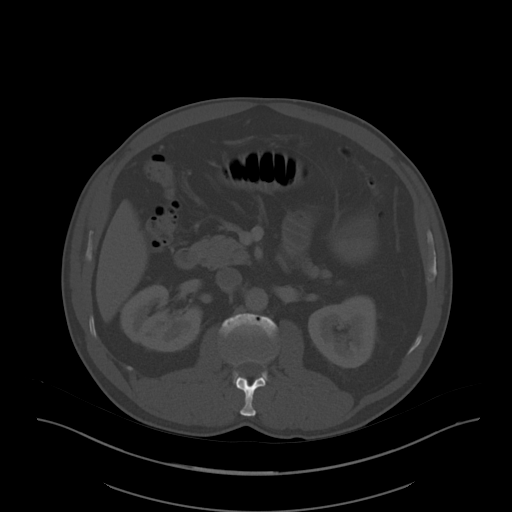
[im 77/103  soft-tissue]
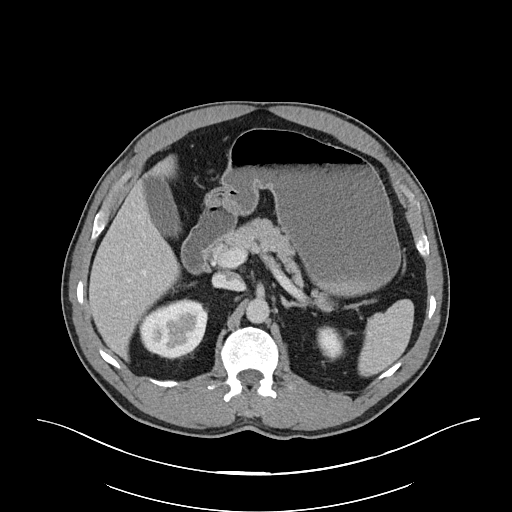
[im 90/103  soft-tissue]
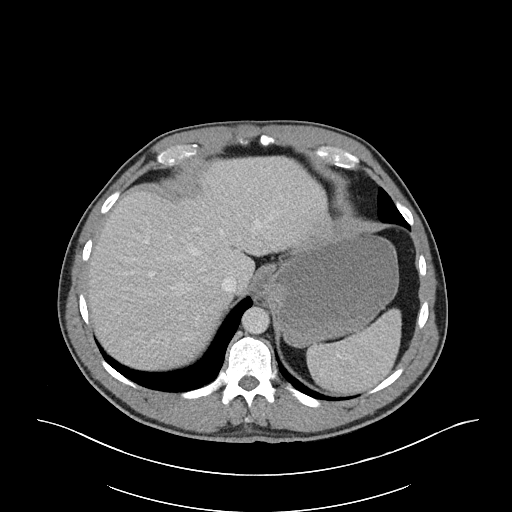
[im 96/103  soft-tissue]
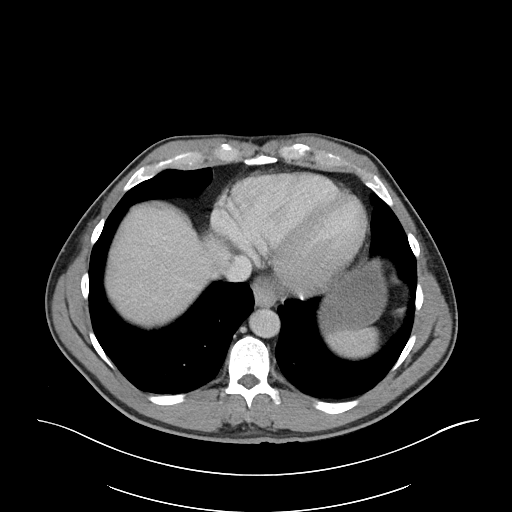

[Series 6: abdomen 3.0 mpr cor · coronal · 0.80mm/px · 3 of 108 slices shown]
[im 36/108  soft-tissue]
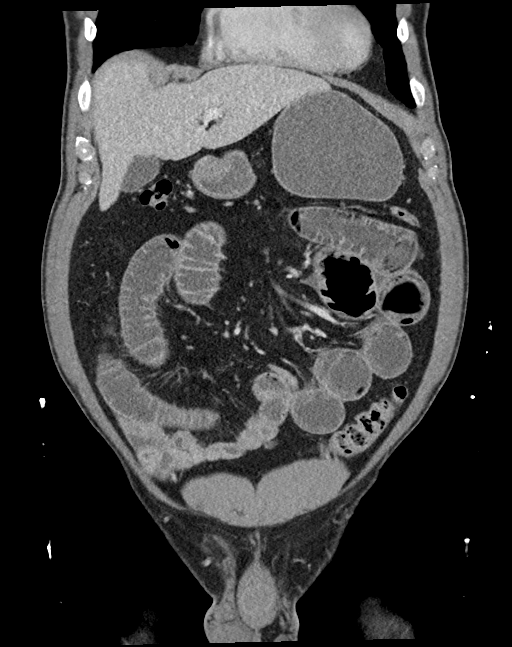
[im 48/108  soft-tissue]
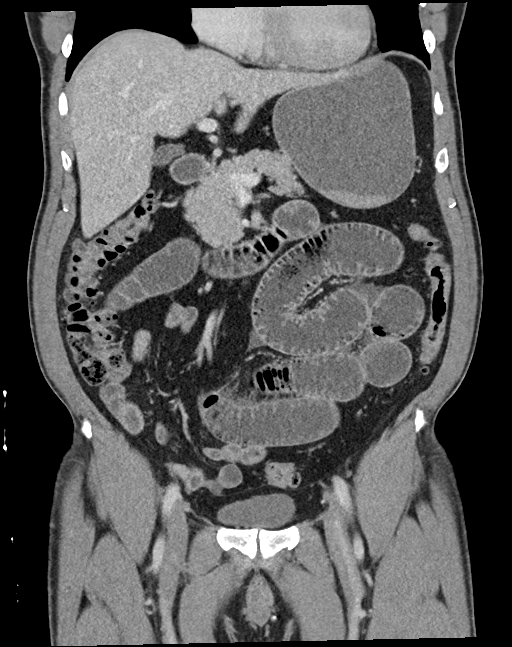
[im 60/108  soft-tissue]
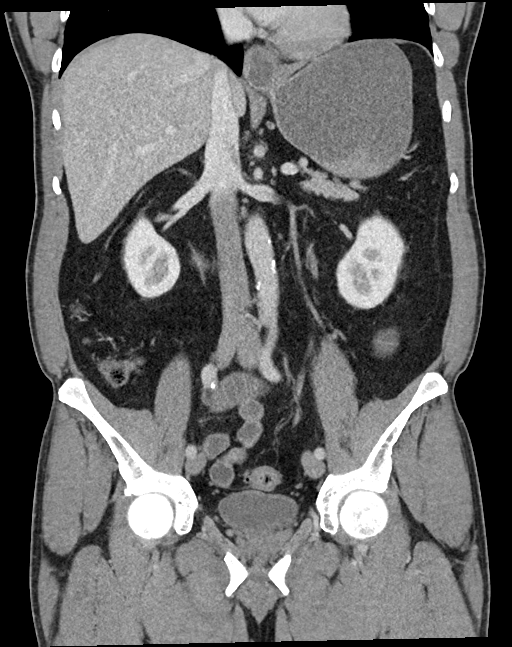

[15 of 46 positions shown; findings below may reference images not displayed]

FINDINGS: Lower chest: The visualized heart size within normal limits. No
pericardial fluid/thickening.

There is fluid seen refluxed within to the mildly dilated distal
esophagus.

The visualized portions of the lungs are clear.

Hepatobiliary: The liver is normal in density without focal
abnormality.The main portal vein is patent. No evidence of calcified
gallstones, gallbladder wall thickening or biliary dilatation.

Pancreas: Unremarkable. No pancreatic ductal dilatation or
surrounding inflammatory changes.

Spleen: Normal in size without focal abnormality.

Adrenals/Urinary Tract: Both adrenal glands appear normal. The
kidneys and collecting system appear normal without evidence of
urinary tract calculus or hydronephrosis. Bladder is unremarkable.

Stomach/Bowel: There is a moderately dilated fluid and air-filled
stomach. There is also moderately dilated air and fluid-filled loops
of proximal small bowel to the level of the distal jejunum measuring
up to 4.1 cm in transverse dimension. There is a focal area of
narrowing seen within the proximal ileal loops, series 6, image 30.
The remainder of the ileal loops are decompressed. No focal bowel
wall thickening is seen. There is a moderate amount of colonic stool
with scattered colonic diverticulosis. The appendix is normal in
appearance.

Vascular/Lymphatic: There are no enlarged mesenteric,
retroperitoneal, or pelvic lymph nodes. Scattered aortic
atherosclerotic calcifications are seen without aneurysmal
dilatation.

Reproductive: The prostate is unremarkable.

Other: There is trace free fluid seen within the mid abdomen.

Musculoskeletal: No acute or significant osseous findings. Grade 1
anterolisthesis of L5 on S1 is seen measuring 3 mm with chronic
bilateral pars defects at L5.
IMPRESSION: 1. Moderately dilated stomach and small bowel to the level of the
proximal ileum where there is a focal area of narrowing, which is
likely due to a ileus versus partial small bowel obstruction.
2. Small amount of reflux within a mildly dilated distal esophagus.
3.  Aortic Atherosclerosis (VTIC7-HBP.P).
4. Diverticulosis without diverticulitis.

## 2021-03-06 DIAGNOSIS — F4323 Adjustment disorder with mixed anxiety and depressed mood: Secondary | ICD-10-CM | POA: Diagnosis not present

## 2021-03-23 ENCOUNTER — Other Ambulatory Visit: Payer: Self-pay

## 2021-03-23 ENCOUNTER — Ambulatory Visit (INDEPENDENT_AMBULATORY_CARE_PROVIDER_SITE_OTHER): Payer: BC Managed Care – PPO | Admitting: Family Medicine

## 2021-03-23 ENCOUNTER — Encounter: Payer: Self-pay | Admitting: Family Medicine

## 2021-03-23 DIAGNOSIS — Z23 Encounter for immunization: Secondary | ICD-10-CM | POA: Diagnosis not present

## 2021-03-27 ENCOUNTER — Other Ambulatory Visit: Payer: Self-pay

## 2021-03-27 ENCOUNTER — Ambulatory Visit (AMBULATORY_SURGERY_CENTER): Payer: BC Managed Care – PPO | Admitting: *Deleted

## 2021-03-27 ENCOUNTER — Encounter: Payer: Self-pay | Admitting: Gastroenterology

## 2021-03-27 VITALS — Ht 74.0 in | Wt 218.0 lb

## 2021-03-27 DIAGNOSIS — Z86010 Personal history of colonic polyps: Secondary | ICD-10-CM

## 2021-03-27 MED ORDER — PEG-KCL-NACL-NASULF-NA ASC-C 100 G PO SOLR: 1.0000 | Freq: Once | ORAL | 0 refills | Status: AC

## 2021-03-27 NOTE — Progress Notes (Signed)
No egg or soy allergy known to patient  No issues known to pt with past sedation with any surgeries or procedures Patient denies ever being told they had issues or difficulty with intubation  No FH of Malignant Hyperthermia Pt is not on diet pills Pt is not on  home 02  Pt is not on blood thinners  Pt denies issues with constipation  No A fib or A flutter  Pt is fully vaccinated  for Covid    NO PA's for preps discussed with pt In PV today  Discussed with pt there will be an out-of-pocket cost for prep and that varies from $0 to 70 +  dollars - pt verbalized understanding   Due to the COVID-19 pandemic we are asking patients to follow certain guidelines in PV and the [place]   Pt aware of COVID protocols and [facility] guidelines   Pt verified name, DOB, address and insurance during PV today.  Pt mailed instruction packet of Emmi video, copy of consent form to read and not return, and instructions.   PV completed over the phone.  Pt encouraged to call with questions or issues.  My Chart instructions to pt as well

## 2021-04-10 ENCOUNTER — Ambulatory Visit (AMBULATORY_SURGERY_CENTER): Payer: BC Managed Care – PPO | Admitting: Gastroenterology

## 2021-04-10 ENCOUNTER — Encounter: Payer: Self-pay | Admitting: Gastroenterology

## 2021-04-10 VITALS — BP 111/79 | HR 56 | Temp 97.5°F | Resp 16 | Ht 74.0 in | Wt 218.0 lb

## 2021-04-10 DIAGNOSIS — Z1211 Encounter for screening for malignant neoplasm of colon: Secondary | ICD-10-CM | POA: Diagnosis not present

## 2021-04-10 DIAGNOSIS — D122 Benign neoplasm of ascending colon: Secondary | ICD-10-CM

## 2021-04-10 DIAGNOSIS — D125 Benign neoplasm of sigmoid colon: Secondary | ICD-10-CM | POA: Diagnosis not present

## 2021-04-10 DIAGNOSIS — Z86010 Personal history of colonic polyps: Secondary | ICD-10-CM | POA: Diagnosis not present

## 2021-04-10 MED ORDER — SODIUM CHLORIDE 0.9 % IV SOLN: 500.0000 mL | Freq: Once | INTRAVENOUS | Status: DC

## 2021-04-10 NOTE — Progress Notes (Signed)
VS completed by DT.  Pt's states no medical or surgical changes since previsit or office visit.  

## 2021-04-10 NOTE — Progress Notes (Signed)
To PACU< VSS. Report to [email or web address] 

## 2021-04-10 NOTE — Progress Notes (Signed)
Called to room to assist during endoscopic procedure.  Patient ID and intended procedure confirmed with present staff. Received instructions for my participation in the procedure from the performing physician.  

## 2021-04-10 NOTE — Op Note (Signed)
[facility] Patient Name: Paul Rich Procedure Date: 04/10/2021 9:50 AM MRN: [id number] Endoscopist: Remo Lipps P. Havery Moros , MD Age: 60 Referring MD:  Date of Birth: 23-Aug-1960 Gender: Male Account #: [id number] Procedure:                Colonoscopy Indications:              High risk colon cancer surveillance: Personal                            history of colonic polyps - 11/2015 - adenoma.                            Patient also with history of ileus / partial SBO                            08/2019 at the level of the ileum, follow up CT Scan                            showed no abnormality Medicines:                Monitored Anesthesia Care Procedure:                Pre-Anesthesia Assessment:                           - Prior to the procedure, a History and Physical                            was performed, and patient medications and                            allergies were reviewed. The patient's tolerance of                            previous anesthesia was also reviewed. The risks                            and benefits of the procedure and the sedation                            options and risks were discussed with the patient.                            All questions were answered, and informed consent                            was obtained. Prior Anticoagulants: The patient has                            taken no previous anticoagulant or antiplatelet                            agents. ASA Grade Assessment: II - A patient with  mild systemic disease. After reviewing the risks                            and benefits, the patient was deemed in                            satisfactory condition to undergo the procedure.                           After obtaining informed consent, the colonoscope                            was passed under direct vision. Throughout the                            procedure, the patient's blood pressure,  pulse, and                            oxygen saturations were monitored continuously. The                            Colonoscope was introduced through the anus and                            advanced to the the terminal ileum, with                            identification of the appendiceal orifice and IC                            valve. The colonoscopy was performed without                            difficulty. The patient tolerated the procedure                            well. The quality of the bowel preparation was                            good. The terminal ileum, ileocecal valve,                            appendiceal orifice, and rectum were photographed. Scope In: 9:56:27 AM Scope Out: 10:12:49 AM Scope Withdrawal Time: 0 hours 14 minutes 46 seconds  Total Procedure Duration: 0 hours 16 minutes 22 seconds  Findings:                 The perianal and digital rectal examinations were                            normal.                           The terminal ileum appeared normal.  Two sessile polyps were found in the ascending                            colon. The polyps were 2 to 3 mm in size. These                            polyps were removed with a cold snare. Resection                            and retrieval were complete.                           Two sessile polyps were found in the sigmoid colon.                            The polyps were 3 to 5 mm in size. These polyps                            were removed with a cold snare. Resection and                            retrieval were complete.                           Multiple medium-mouthed diverticula were found in                            the ascending colon and left colon.                           Internal hemorrhoids were found during retroflexion.                           The exam was otherwise without abnormality. Complications:            No immediate complications. Estimated  blood loss:                            Minimal. Estimated Blood Loss:     Estimated blood loss was minimal. Impression:               - The examined portion of the ileum was normal.                           - Two 2 to 3 mm polyps in the ascending colon,                            removed with a cold snare. Resected and retrieved.                           - Two 3 to 5 mm polyps in the sigmoid colon,                            removed with  a cold snare. Resected and retrieved.                           - Diverticulosis in the ascending colon and in the                            left colon.                           - Internal hemorrhoids.                           - The examination was otherwise normal. Recommendation:           - Patient has a contact number available for                            emergencies. The signs and symptoms of potential                            delayed complications were discussed with the                            patient. Return to normal activities tomorrow.                            Written discharge instructions were provided to the                            patient.                           - Resume previous diet.                           - Continue present medications.                           - Await pathology results. Remo Lipps P. Evian Derringer, MD 04/10/2021 10:18:05 AM This report has been signed electronically.

## 2021-04-10 NOTE — Progress Notes (Signed)
[facility] History and Physical   Primary Care Physician:  Wendie Agreste, MD   Reason for Procedure:   History of colon polyps  Plan:    colonoscopy     HPI: Paul Rich is a 60 y.o. male  here for colonoscopy surveillance - adenoma removed 05/2016. Patient denies any bowel symptoms at this time. No family history of colon cancer known. Otherwise feels well without any cardiopulmonary symptoms.    Past Medical History:  Diagnosis Date   Allergy    pollen   AV block    Childhood asthma    Environmental allergies    Hemorrhage of rectum and anus    Other and unspecified hyperlipidemia    SBO (small bowel obstruction) ([place]) 08/2019   Sleep apnea    stopped using cpap years ago   Testosterone deficiency in male 08/04/2019    Past Surgical History:  Procedure Laterality Date   COLONOSCOPY     HERNIA REPAIR  06/03/2010   inguinal and umbilical   INGUINAL HERNIA REPAIR     in high school   POLYPECTOMY     WISDOM TOOTH EXTRACTION     age 3    Prior to Admission medications   Medication Sig Start Date End Date Taking? Authorizing Provider  aspirin 81 MG tablet Take 81 mg by mouth daily.   Yes [provider]  Multiple Vitamin (MULTIVITAMIN) tablet Take 1 tablet by mouth daily.   Yes [provider]  niacinamide 500 MG tablet Take 500 mg by mouth 2 (two) times daily with a meal.   Yes [provider]  omega-3 acid ethyl esters (LOVAZA) 1 g capsule Take 2 capsules (2 g total) by mouth 2 (two) times daily. 11/30/20  Yes Wendie Agreste, MD  OVER THE COUNTER MEDICATION Take 1 capsule by mouth daily. Nitric Oxide   Yes [provider]  OVER THE COUNTER MEDICATION Take 1 capsule by mouth daily. Vitamin A & D Combination   Yes [provider]  pravastatin (PRAVACHOL) 20 MG tablet Take 1 tablet (20 mg total) by mouth daily. 11/30/20  Yes Wendie Agreste, MD  Pregnenolone Micronized (PREGNENOLONE PO) Take 1  capsule by mouth daily.   Yes [provider]  PRESCRIPTION MEDICATION HCG injectable taking 2 ml  Two times weekly   Yes [provider]  RESVERATROL PO Take 1 capsule by mouth daily.   Yes [provider]  tadalafil (CIALIS) 5 MG tablet Take 1 tablet (5 mg total) by mouth daily as needed for erectile dysfunction. 11/30/20  Yes Wendie Agreste, MD  testosterone cypionate (DEPOTESTOSTERONE CYPIONATE) 200 MG/ML injection Inject 90 mg into the muscle every 7 (seven) days. 0.45 mL Twice a week   Yes [provider]    Current Outpatient Medications  Medication Sig Dispense Refill   aspirin 81 MG tablet Take 81 mg by mouth daily.     Multiple Vitamin (MULTIVITAMIN) tablet Take 1 tablet by mouth daily.     niacinamide 500 MG tablet Take 500 mg by mouth 2 (two) times daily with a meal.     omega-3 acid ethyl esters (LOVAZA) 1 g capsule Take 2 capsules (2 g total) by mouth 2 (two) times daily. 360 capsule 3   OVER THE COUNTER MEDICATION Take 1 capsule by mouth daily. Nitric Oxide     OVER THE COUNTER MEDICATION Take 1 capsule by mouth daily. Vitamin A & D Combination     pravastatin (PRAVACHOL) 20 MG  tablet Take 1 tablet (20 mg total) by mouth daily. 90 tablet 2   Pregnenolone Micronized (PREGNENOLONE PO) Take 1 capsule by mouth daily.     PRESCRIPTION MEDICATION HCG injectable taking 2 ml  Two times weekly     RESVERATROL PO Take 1 capsule by mouth daily.     tadalafil (CIALIS) 5 MG tablet Take 1 tablet (5 mg total) by mouth daily as needed for erectile dysfunction. 90 tablet 1   testosterone cypionate (DEPOTESTOSTERONE CYPIONATE) 200 MG/ML injection Inject 90 mg into the muscle every 7 (seven) days. 0.45 mL Twice a week     Current Facility-Administered Medications  Medication Dose Route Frequency Provider Last Rate Last Admin   0.9 %  sodium chloride infusion  500 mL Intravenous Once Marleigh Kaylor, Carlota Raspberry, MD        Allergies as of 04/10/2021 - Review  Complete 04/10/2021  Allergen Reaction Noted   Shellfish allergy Anaphylaxis and Swelling 05/21/2011   Other Hives 05/31/2020   Mold extract [trichophyton]  06/01/2013    Family History  Problem Relation Age of Onset   Diabetes Mother    Heart disease Mother    Hyperlipidemia Mother    Hypertension Mother    Heart disease Maternal Grandmother    Hyperlipidemia Maternal Grandmother    Heart disease Maternal Grandfather    Kidney disease Paternal Grandmother    Lung cancer Paternal Grandmother    Lung cancer Paternal Grandfather    Heart disease Paternal Grandfather    Colon cancer Neg Hx    Colon polyps Neg Hx    Esophageal cancer Neg Hx    Rectal cancer Neg Hx    Stomach cancer Neg Hx     Social History   Socioeconomic History   Marital status: Married    Spouse name: Not on file   Number of children: Not on file   Years of education: Not on file   Highest education level: Not on file  Occupational History   Occupation: HR exec-business  Tobacco Use   Smoking status: Never   Smokeless tobacco: Never  Vaping Use   Vaping Use: Never used  Substance and Sexual Activity   Alcohol use: Yes    Alcohol/week: 3.0 - 4.0 standard drinks    Types: 3 - 4 Shots of liquor per week    Comment: occ red wine and beer but usually burbon   Drug use: No   Sexual activity: Yes  Other Topics Concern   Not on file  Social History Narrative   Divorced   Exercise: Tes, 4 times a week   Education: PHD   Social Determinants of Radio broadcast assistant Strain: Not on file  Food Insecurity: Not on file  Transportation Needs: Not on file  Physical Activity: Not on file  Stress: Not on file  Social Connections: Not on file  Intimate Partner Violence: Not on file    Review of Systems: All other review of systems negative except as mentioned in the HPI.  Physical Exam: Vital signs BP 117/62   Pulse 68   Temp (!) 97.5 F (36.4 C) (Temporal)   Ht 6\' 2"  (1.88 m)   Wt 218  lb (98.9 kg)   SpO2 95%   BMI 27.99 kg/m   General:   Alert,  Well-developed, pleasant and cooperative in NAD Lungs:  Clear throughout to auscultation.   Heart:  Regular rate and rhythm Abdomen:  Soft, nontender and nondistended.   Neuro/Psych:  Alert and cooperative.  Normal mood and affect. A and O x 3  Jolly Mango, MD [facility]

## 2021-04-10 NOTE — Patient Instructions (Signed)
Handouts given on polyps, hemorrhoids and diverticulosis.  YOU HAD AN ENDOSCOPIC PROCEDURE TODAY AT [facility]:   Refer to the procedure report that was given to you for any specific questions about what was found during the examination.  If the procedure report does not answer your questions, please call your gastroenterologist to clarify.  If you requested that your care partner not be given the details of your procedure findings, then the procedure report has been included in a sealed envelope for you to review at your convenience later.  YOU SHOULD EXPECT: Some feelings of bloating in the abdomen. Passage of more gas than usual.  Walking can help get rid of the air that was put into your GI tract during the procedure and reduce the bloating. If you had a lower endoscopy (such as a colonoscopy or flexible sigmoidoscopy) you may notice spotting of blood in your stool or on the toilet paper. If you underwent a bowel prep for your procedure, you may not have a normal bowel movement for a few days.  Please Note:  You might notice some irritation and congestion in your nose or some drainage.  This is from the oxygen used during your procedure.  There is no need for concern and it should clear up in a day or so.  SYMPTOMS TO REPORT IMMEDIATELY:  Following lower endoscopy (colonoscopy or flexible sigmoidoscopy):  Excessive amounts of blood in the stool  Significant tenderness or worsening of abdominal pains  Swelling of the abdomen that is new, acute  Fever of 100F or higher   For urgent or emergent issues, a gastroenterologist can be reached at any hour by calling [phone]. Do not use MyChart messaging for urgent concerns.    DIET:  We do recommend a small meal at first, but then you may proceed to your regular diet.  Drink plenty of fluids but you should avoid alcoholic beverages for 24 hours.  ACTIVITY:  You should plan to take it easy for the rest of today and you  should NOT DRIVE or use heavy machinery until tomorrow (because of the sedation medicines used during the test).    FOLLOW UP: Our staff will call the number listed on your records 48-72 hours following your procedure to check on you and address any questions or concerns that you may have regarding the information given to you following your procedure. If we do not reach you, we will leave a message.  We will attempt to reach you two times.  During this call, we will ask if you have developed any symptoms of COVID 19. If you develop any symptoms (ie: fever, flu-like symptoms, shortness of breath, cough etc.) before then, please call [phone].  If you test positive for Covid 19 in the 2 weeks post procedure, please call and report this information to [place].    If any biopsies were taken you will be contacted by phone or by letter within the next 1-3 weeks.  Please call us at [phone] if you have not heard about the biopsies in 3 weeks.    SIGNATURES/CONFIDENTIALITY: You and/or your care partner have signed paperwork which will be entered into your electronic medical record.  These signatures attest to the fact that that the information above on your After Visit Summary has been reviewed and is understood.  Full responsibility of the confidentiality of this discharge information lies with you and/or your care-partner.

## 2021-04-12 ENCOUNTER — Telehealth: Payer: Self-pay | Admitting: *Deleted

## 2021-04-12 NOTE — Telephone Encounter (Signed)
  Follow up Call-  Call back number 04/10/2021  Post procedure Call Back phone  # [phone]  Permission to leave phone message Yes  Some recent data might be hidden     Patient questions:  Do you have a fever, pain , or abdominal swelling? No. Pain Score  0 *  Have you tolerated food without any problems? Yes.    Have you been able to return to your normal activities? Yes.    Do you have any questions about your discharge instructions: Diet   No. Medications  No. Follow up visit  No.  Do you have questions or concerns about your Care? No.  Actions: * If pain score is 4 or above: No action needed, pain <4.  Have you developed a fever since your procedure? no  2.   Have you had an respiratory symptoms (SOB or cough) since your procedure? no  3.   Have you tested positive for COVID 19 since your procedure no  4.   Have you had any family members/close contacts diagnosed with the COVID 19 since your procedure?  no   If yes to any of these questions please route to Joylene John, RN and Joella Prince, RN

## 2021-04-14 DIAGNOSIS — F4323 Adjustment disorder with mixed anxiety and depressed mood: Secondary | ICD-10-CM | POA: Diagnosis not present

## 2021-04-16 DIAGNOSIS — F4323 Adjustment disorder with mixed anxiety and depressed mood: Secondary | ICD-10-CM | POA: Diagnosis not present

## 2021-05-05 DIAGNOSIS — F4323 Adjustment disorder with mixed anxiety and depressed mood: Secondary | ICD-10-CM | POA: Diagnosis not present

## 2021-05-06 DIAGNOSIS — L309 Dermatitis, unspecified: Secondary | ICD-10-CM | POA: Diagnosis not present

## 2021-05-14 ENCOUNTER — Telehealth (INDEPENDENT_AMBULATORY_CARE_PROVIDER_SITE_OTHER): Payer: BC Managed Care – PPO | Admitting: Family

## 2021-05-14 ENCOUNTER — Encounter: Payer: Self-pay | Admitting: Family

## 2021-05-14 VITALS — Ht 74.0 in | Wt 218.0 lb

## 2021-05-14 DIAGNOSIS — U071 COVID-19: Secondary | ICD-10-CM | POA: Diagnosis not present

## 2021-05-14 MED ORDER — MOLNUPIRAVIR EUA 200MG CAPSULE
4.0000 | ORAL_CAPSULE | Freq: Two times a day (BID) | ORAL | 0 refills | Status: AC
Start: 2021-05-14 — End: 2021-05-19

## 2021-05-14 NOTE — Progress Notes (Signed)
MyChart Video Visit    Virtual Visit via Video Note   This visit type was conducted due to national recommendations for restrictions regarding the COVID-19 Pandemic (e.g. social distancing) in an effort to limit this patient's exposure and mitigate transmission in our community. This patient is at least at moderate risk for complications without adequate follow up. This format is felt to be most appropriate for this patient at this time. Physical exam was limited by quality of the video and audio technology used for the visit. CMA was able to get the patient set up on a video visit.  Patient location: Home. Patient and provider in visit Provider location: Office  I discussed the limitations of evaluation and management by telemedicine and the availability of in person appointments. The patient expressed understanding and agreed to proceed.  Visit Date: 05/14/2021  Today's healthcare provider: Jeanie Sewer, NP     Subjective:    Patient ID: Paul Rich, male    DOB: 08-15-1960, 60 y.o.   MRN: [id number]  Chief Complaint  Patient presents with   Covid Positive    Symptoms started yesterday morning. Tested yesterday. Wife tested positive as well.    Sore Throat   Cough   Diarrhea    HPI: Upper Respiratory Infection: Symptoms include non productive cough, sore throat, and diarrhea.  Onset of symptoms was 1 day ago, gradually worsening since that time. He is drinking moderate amounts of fluids. Evaluation to date: none.  Treatment to date: none. Took a home covid test and tested positive.   Past Medical History:  Diagnosis Date   Allergy    pollen   AV block    Childhood asthma    Environmental allergies    Hemorrhage of rectum and anus    Other and unspecified hyperlipidemia    SBO (small bowel obstruction) ([place]) 08/2019   Sleep apnea    stopped using cpap years ago   Testosterone deficiency in male 08/04/2019    Past Surgical History:  Procedure  Laterality Date   COLONOSCOPY     HERNIA REPAIR  06/03/2010   inguinal and umbilical   INGUINAL HERNIA REPAIR     in high school   POLYPECTOMY     WISDOM TOOTH EXTRACTION     age 10    Outpatient Medications Prior to Visit  Medication Sig Dispense Refill   aspirin 81 MG tablet Take 81 mg by mouth daily.     Multiple Vitamin (MULTIVITAMIN) tablet Take 1 tablet by mouth daily.     niacinamide 500 MG tablet Take 500 mg by mouth 2 (two) times daily with a meal.     omega-3 acid ethyl esters (LOVAZA) 1 g capsule Take 2 capsules (2 g total) by mouth 2 (two) times daily. 360 capsule 3   OVER THE COUNTER MEDICATION Take 1 capsule by mouth daily. Nitric Oxide     OVER THE COUNTER MEDICATION Take 1 capsule by mouth daily. Vitamin A & D Combination     pravastatin (PRAVACHOL) 20 MG tablet Take 1 tablet (20 mg total) by mouth daily. 90 tablet 2   Pregnenolone Micronized (PREGNENOLONE PO) Take 1 capsule by mouth daily.     PRESCRIPTION MEDICATION HCG injectable taking 2 ml  Two times weekly     RESVERATROL PO Take 1 capsule by mouth daily.     tadalafil (CIALIS) 5 MG tablet Take 1 tablet (5 mg total) by mouth daily as needed for erectile dysfunction. 90 tablet 1  testosterone cypionate (DEPOTESTOSTERONE CYPIONATE) 200 MG/ML injection Inject 90 mg into the muscle every 7 (seven) days. 0.45 mL Twice a week     triamcinolone cream (KENALOG) 0.1 % Apply topically 2 (two) times daily.     valACYclovir (VALTREX) 1000 MG tablet Take 1,000 mg by mouth 2 (two) times daily.     No facility-administered medications prior to visit.    Allergies  Allergen Reactions   Shellfish Allergy Anaphylaxis and Swelling    Of swelling of eyes   Other Hives   Mold Extract [Trichophyton]         Objective:     Physical Exam Vitals and nursing note reviewed.  Constitutional:      General: She is not in acute distress.    Appearance: Normal appearance.  HENT:     Head: Normocephalic.  Pulmonary:      Effort: No respiratory distress.  Musculoskeletal:     Cervical back: Normal range of motion.  Skin:    General: Skin is dry.     Coloration: Skin is not pale.  Neurological:     Mental Status: She is alert and oriented to person, place, and time.  Psychiatric:        Mood and Affect: Mood normal.   Ht 6\' 2"  (1.88 m)   Wt 218 lb 0.6 oz (98.9 kg)   BMI 27.99 kg/m   Wt Readings from Last 3 Encounters:  05/14/21 218 lb 0.6 oz (98.9 kg)  04/10/21 218 lb (98.9 kg)  03/27/21 218 lb (98.9 kg)       Assessment & Plan:   Problem List Items Addressed This Visit       Other   COVID-19 - Primary    Starting Molnupiravir, pt advised on use & SE, experimental designation. Advised of CDC guidelines for self isolation/ ending isolation.  Advised of safe practice guidelines. Symptom Tier reviewed.  Encouraged to monitor for any worsening symptoms; watch for increased shortness of breath, weakness, and signs of dehydration. Advised when to seek emergency care.  Instructed to rest and hydrate well.  Advised to leave the house during recommended isolation period, only if it is necessary to seek medical care       Relevant Medications      molnupiravir EUA (LAGEVRIO) 200 mg CAPS capsule    Meds ordered this encounter  Medications   molnupiravir EUA (LAGEVRIO) 200 mg CAPS capsule    Sig: Take 4 capsules (800 mg total) by mouth 2 (two) times daily for 5 days.    Dispense:  40 capsule    Refill:  0    Order Specific Question:   Supervising Provider    Answer:   ANDY, CAMILLE L [[id number]]    I discussed the assessment and treatment plan with the patient. The patient was provided an opportunity to ask questions and all were answered. The patient agreed with the plan and demonstrated an understanding of the instructions.   The patient was advised to call back or seek an in-person evaluation if the symptoms worsen or if the condition fails to improve as anticipated.  I provided 22 minutes of  face-to-face time during this encounter.   Jeanie Sewer, NP [place] [phone] (phone) [phone] (fax)  [place]

## 2021-05-14 NOTE — Assessment & Plan Note (Signed)
Starting [employer], pt advised on use & SE, experimental designation. Advised of CDC guidelines for self isolation/ ending isolation.  Advised of safe practice guidelines. Symptom Tier reviewed.  Encouraged to monitor for any worsening symptoms; watch for increased shortness of breath, weakness, and signs of dehydration. Advised when to seek emergency care.  Instructed to rest and hydrate well.  Advised to leave the house during recommended isolation period, only if it is necessary to seek medical care

## 2021-06-09 DIAGNOSIS — F4323 Adjustment disorder with mixed anxiety and depressed mood: Secondary | ICD-10-CM | POA: Diagnosis not present

## 2021-06-11 ENCOUNTER — Ambulatory Visit (INDEPENDENT_AMBULATORY_CARE_PROVIDER_SITE_OTHER): Payer: BC Managed Care – PPO | Admitting: Family Medicine

## 2021-06-11 ENCOUNTER — Other Ambulatory Visit: Payer: Self-pay

## 2021-06-11 ENCOUNTER — Telehealth: Payer: Self-pay

## 2021-06-11 ENCOUNTER — Ambulatory Visit (INDEPENDENT_AMBULATORY_CARE_PROVIDER_SITE_OTHER)
Admission: RE | Admit: 2021-06-11 | Discharge: 2021-06-11 | Disposition: A | Discharge status: Home / Self Care | Payer: BC Managed Care – PPO | Source: Ambulatory Visit | Attending: Family Medicine | Admitting: Family Medicine

## 2021-06-11 VITALS — BP 122/78 | HR 68 | Temp 98.1°F | Resp 17 | Ht 74.0 in | Wt 232.2 lb

## 2021-06-11 DIAGNOSIS — Z8616 Personal history of COVID-19: Secondary | ICD-10-CM | POA: Diagnosis not present

## 2021-06-11 DIAGNOSIS — R0609 Other forms of dyspnea: Secondary | ICD-10-CM | POA: Diagnosis not present

## 2021-06-11 DIAGNOSIS — N529 Male erectile dysfunction, unspecified: Secondary | ICD-10-CM | POA: Diagnosis not present

## 2021-06-11 DIAGNOSIS — R5383 Other fatigue: Secondary | ICD-10-CM | POA: Diagnosis not present

## 2021-06-11 DIAGNOSIS — E785 Hyperlipidemia, unspecified: Secondary | ICD-10-CM

## 2021-06-11 DIAGNOSIS — R251 Tremor, unspecified: Secondary | ICD-10-CM

## 2021-06-11 LAB — COMPREHENSIVE METABOLIC PANEL
ALT: 40 U/L (ref 0–53)
AST: 28 U/L (ref 0–37)
Albumin: 4.6 g/dL (ref 3.5–5.2)
Alkaline Phosphatase: 48 U/L (ref 39–117)
BUN: 16 mg/dL (ref 6–23)
CO2: 26 mEq/L (ref 19–32)
Calcium: 9.8 mg/dL (ref 8.4–10.5)
Chloride: 100 mEq/L (ref 96–112)
Creatinine, Ser: 1.11 mg/dL (ref 0.40–1.50)
GFR: 72.63 mL/min (ref >60.00)
Glucose, Bld: 79 mg/dL (ref 70–99)
Potassium: 4.2 mEq/L (ref 3.5–5.1)
Sodium: 138 mEq/L (ref 135–145)
Total Bilirubin: 0.8 mg/dL (ref 0.2–1.2)
Total Protein: 7.5 g/dL (ref 6.0–8.3)

## 2021-06-11 LAB — LIPID PANEL
Cholesterol: 270 mg/dL — ABNORMAL HIGH (ref 0–200)
HDL: 33.2 mg/dL — ABNORMAL LOW (ref >39.00)
Total CHOL/HDL Ratio: 8
Triglycerides: 958 mg/dL — ABNORMAL HIGH (ref 0.0–149.0)

## 2021-06-11 LAB — LDL CHOLESTEROL, DIRECT: Direct LDL: 91 mg/dL

## 2021-06-11 LAB — CBC
HCT: 57.7 % — ABNORMAL HIGH (ref 39.0–52.0)
Hemoglobin: 19.3 g/dL (ref 13.0–17.0)
MCHC: 33.4 g/dL (ref 30.0–36.0)
MCV: 90 fl (ref 78.0–100.0)
Platelets: 169 10*3/uL (ref 150.0–400.0)
RBC: 6.41 Mil/uL — ABNORMAL HIGH (ref 4.22–5.81)
RDW: 15 % (ref 11.5–15.5)
WBC: 10.4 10*3/uL (ref 4.0–10.5)

## 2021-06-11 LAB — TSH: TSH: 1.53 u[IU]/mL (ref 0.35–5.50)

## 2021-06-11 MED ORDER — TADALAFIL 5 MG PO TABS: 5.0000 mg | ORAL_TABLET | Freq: Every day | ORAL | 1 refills | Status: DC | PRN

## 2021-06-11 MED ORDER — PRAVASTATIN SODIUM 20 MG PO TABS: 20.0000 mg | ORAL_TABLET | Freq: Every day | ORAL | 1 refills | Status: DC

## 2021-06-11 NOTE — Telephone Encounter (Signed)
Received post it from [place] who wrote down critical message from main lab site.   Pt Hemaglobin is 19.3

## 2021-06-11 NOTE — Progress Notes (Signed)
Subjective:  Patient ID: Paul Rich, male    DOB: 1960/09/21  Age: 61 y.o. MRN: [id number]  CC:  Chief Complaint  Patient presents with   Hyperlipidemia    Pt here for recheck no concerns    Covid Positive    Pt had COVID 05/13/21 and has had some long running symptoms, fatigue, cannot work out the same as previously, feels he will become short of breath easier,    Tremors    Pt reports tremors in hands following workouts, other activities reports this started over the last few months    Erectile Dysfunction    Pt needs refill no concerns     HPI Paul Rich presents for   COVID-19 infection with persistent dyspnea COVID + 05/13/2021. Treated with molnupiravir. Improved in 8-10 days. Went on cruise soon after. Has had some persistent dyspnea on exertion with his exercise - winded faster, fatigue with less activity. Min cough with covid, sporadic now. No recent fever. No night sweats.  No chest pain. No leg swelling or calf pain. No new palpitations.  No paroxysmal nocturnal dyspnea.   Hand tremors Past few months, noticed after hard workouts. Stops with putting hand down. Inconsistent, both sides. Not every time, but with upper body workouts. Not noticed other than after workouts. No prior hx of tremor, no weakness. Improves after recovery. No resting tremor.   Erectile dysfunction Takes Cialis 5 mg daily. No new side effects, no hearing/vision changes or CP with exertion.  Treated by [facility] with testosterone, with monitoring labs.   Hyperlipidemia: Niacinamide 500 mg twice daily, pravastatin 20 mg daily, Lovaza 2 g twice daily.  No new myalgias, side effects.  Lab Results  Component Value Date   CHOL 215 (H) 12/19/2020   HDL 33.30 (L) 12/19/2020   LDLCALC 92 02/29/2016   LDLDIRECT 101.0 12/19/2020   TRIG (H) 12/19/2020    464.0 Triglyceride is over 400; calculations on Lipids are invalid.   CHOLHDL 6 12/19/2020   Lab Results  Component Value Date    ALT 37 12/19/2020   AST 27 12/19/2020   ALKPHOS 47 12/19/2020   BILITOT 1.0 12/19/2020     History Patient Active Problem List   Diagnosis Date Noted   COVID-19 05/14/2021   SBO (small bowel obstruction) ([place]) 08/04/2019   Testosterone deficiency in male 08/04/2019   Allergic rhinitis due to other allergen 05/24/2011   Allergy to shellfish 05/24/2011   OSA (obstructive sleep apnea) 05/21/2011   HEMORRHOIDS 10/12/2008   COLONIC POLYPS 08/29/2008   Dyslipidemia 08/29/2008   RECTAL BLEEDING 08/29/2008   Past Medical History:  Diagnosis Date   Allergy    pollen   AV block    Childhood asthma    Environmental allergies    Hemorrhage of rectum and anus    Other and unspecified hyperlipidemia    SBO (small bowel obstruction) ([place]) 08/2019   Sleep apnea    stopped using cpap years ago   Testosterone deficiency in male 08/04/2019   Past Surgical History:  Procedure Laterality Date   COLONOSCOPY     HERNIA REPAIR  06/03/2010   inguinal and umbilical   INGUINAL HERNIA REPAIR     in high school   POLYPECTOMY     WISDOM TOOTH EXTRACTION     age 61   Allergies  Allergen Reactions   Shellfish Allergy Anaphylaxis and Swelling    Of swelling of eyes   Other Hives   Mold Extract [Trichophyton]  Prior to Admission medications   Medication Sig Start Date End Date Taking? Authorizing Provider  aspirin 81 MG tablet Take 81 mg by mouth daily.   Yes [provider]  Multiple Vitamin (MULTIVITAMIN) tablet Take 1 tablet by mouth daily.   Yes [provider]  niacinamide 500 MG tablet Take 500 mg by mouth 2 (two) times daily with a meal.   Yes [provider]  omega-3 acid ethyl esters (LOVAZA) 1 g capsule Take 2 capsules (2 g total) by mouth 2 (two) times daily. 11/30/20  Yes Wendie Agreste, MD  OVER THE COUNTER MEDICATION Take 1 capsule by mouth daily. Nitric Oxide   Yes [provider]  OVER THE COUNTER MEDICATION Take 1 capsule by mouth  daily. Vitamin A & D Combination   Yes [provider]  pravastatin (PRAVACHOL) 20 MG tablet Take 1 tablet (20 mg total) by mouth daily. 11/30/20  Yes Wendie Agreste, MD  Pregnenolone Micronized (PREGNENOLONE PO) Take 1 capsule by mouth daily.   Yes [provider]  PRESCRIPTION MEDICATION HCG injectable taking 2 ml  Two times weekly   Yes [provider]  RESVERATROL PO Take 1 capsule by mouth daily.   Yes [provider]  tadalafil (CIALIS) 5 MG tablet Take 1 tablet (5 mg total) by mouth daily as needed for erectile dysfunction. 11/30/20  Yes Wendie Agreste, MD  testosterone cypionate (DEPOTESTOSTERONE CYPIONATE) 200 MG/ML injection Inject 90 mg into the muscle every 7 (seven) days. 0.45 mL Twice a week   Yes [provider]  triamcinolone cream (KENALOG) 0.1 % Apply topically 2 (two) times daily. 05/06/21  Yes [provider]  valACYclovir (VALTREX) 1000 MG tablet Take 1,000 mg by mouth 2 (two) times daily. 05/06/21  Yes [provider]   Social History   Socioeconomic History   Marital status: Married    Spouse name: Not on file   Number of children: Not on file   Years of education: Not on file   Highest education level: Not on file  Occupational History   Occupation: HR exec-business  Tobacco Use   Smoking status: Never   Smokeless tobacco: Never  Vaping Use   Vaping Use: Never used  Substance and Sexual Activity   Alcohol use: Yes    Alcohol/week: 3.0 - 4.0 standard drinks    Types: 3 - 4 Shots of liquor per week    Comment: occ red wine and beer but usually burbon   Drug use: No   Sexual activity: Yes  Other Topics Concern   Not on file  Social History Narrative   Divorced   Exercise: Tes, 4 times a week   Education: PHD   Social Determinants of Radio broadcast assistant Strain: Not on file  Food Insecurity: Not on file  Transportation Needs: Not on file  Physical Activity: Not on file  Stress: Not  on file  Social Connections: Not on file  Intimate Partner Violence: Not on file    Review of Systems Per HPI.   Objective:   Vitals:   06/11/21 0844 06/11/21 0928  Pulse: 68   Resp: 17   Temp: 98.1 F (36.7 C)   TempSrc: Temporal   SpO2: 92% 97%  Weight: 232 lb 3.2 oz (105.3 kg)   Height: 6\' 2"  (1.88 m)      Physical Exam Vitals reviewed.  Constitutional:      Appearance: He is well-developed.  HENT:  Head: Normocephalic and atraumatic.  Neck:     Vascular: No carotid bruit or JVD.  Cardiovascular:     Rate and Rhythm: Normal rate and regular rhythm.     Heart sounds: Normal heart sounds. No murmur heard. Pulmonary:     Effort: Pulmonary effort is normal. No respiratory distress.     Breath sounds: Normal breath sounds. No stridor. No wheezing, rhonchi or rales.  Musculoskeletal:        General: No swelling or tenderness.     Right lower leg: No edema.     Left lower leg: No edema.  Skin:    General: Skin is warm and dry.  Neurological:     General: No focal deficit present.     Mental Status: He is alert and oriented to person, place, and time.     GCS: GCS eye subscore is 4. GCS verbal subscore is 5. GCS motor subscore is 6.     Motor: Motor function is intact.     Coordination: Coordination normal. Finger-Nose-Finger Test and Heel to [place] Test normal.     Gait: Gait is intact.     Comments: Slight tremor bilateral hands with intention, noted with finger-to-nose testing, not at rest.  Psychiatric:        Mood and Affect: Mood normal.       Assessment & Plan:  Paul Rich is a 61 y.o. male . Dyslipidemia - Plan: Comprehensive metabolic panel, Lipid panel Hyperlipidemia, unspecified hyperlipidemia type - Plan: pravastatin (PRAVACHOL) 20 MG tablet  -Tolerating current regimen, continue same with Lovaza, pravastatin, niacinamide.  Denies new arthralgias/myalgias.  Erectile dysfunction, unspecified erectile dysfunction type - Plan: tadalafil  (CIALIS) 5 MG tablet  -Stable with current dose of Cialis, Rx given - use lowest effective dose. Side effects discussed (including but not limited to headache/flushing, blue discoloration of vision, possible vascular steal and risk of cardiac effects if underlying unknown coronary artery disease, and permanent sensorineural hearing loss). Understanding expressed.   DOE (dyspnea on exertion) - Plan: DG Chest 2 View, Pro b natriuretic peptide Fatigue, unspecified type - Plan: DG Chest 2 View, Pro b natriuretic peptide, CBC, TSH History of COVID-19 - Plan: DG Chest 2 View, Pro b natriuretic peptide  -Possible slow return to usual activity after COVID infection, denies chest pain, leg swelling, unlikely DVT/PE. O2 sat normal range but slightly lower than previous readings.  Lungs were clear on exam but will check chest x-ray, BNP for the dyspnea with exercise, CBC, TSH for fatigue.  ER/RTC precautions if any chest pain or change in palpitations.  2-week follow-up.  Tremor of both hands  -Possible muscle fatigue noted more after workouts.  Possible faint tremor/intention tremor on exam in office.  Reports approximately 2 caffeinated drinks per day.  Monitor caffeine intake, recheck 2 weeks, consider neuro eval  Meds ordered this encounter  Medications   tadalafil (CIALIS) 5 MG tablet    Sig: Take 1 tablet (5 mg total) by mouth daily as needed for erectile dysfunction.    Dispense:  90 tablet    Refill:  1   pravastatin (PRAVACHOL) 20 MG tablet    Sig: Take 1 tablet (20 mg total) by mouth daily.    Dispense:  90 tablet    Refill:  1   Patient Instructions  Please have x-ray performed at [facility] location.  I will check some labs for fatigue.  If any new or worsening symptoms be seen right away but otherwise follow-up in 2  weeks and we can discuss labs and symptoms further at that time.  Monitor caffeine intake as that sometimes can affect tremors but I would like to recheck the hand tremor next  visit as well.  No change in medications for now.  Thanks for coming in today.     Signed,   Merri Ray, MD [place], [facility] 06/11/21 9:29 AM

## 2021-06-11 NOTE — Patient Instructions (Signed)
Please have x-ray performed at [facility] location.  I will check some labs for fatigue.  If any new or worsening symptoms be seen right away but otherwise follow-up in 2 weeks and we can discuss labs and symptoms further at that time.  Monitor caffeine intake as that sometimes can affect tremors but I would like to recheck the hand tremor next visit as well.  No change in medications for now.  Thanks for coming in today.

## 2021-06-12 ENCOUNTER — Other Ambulatory Visit: Payer: Self-pay | Admitting: Family Medicine

## 2021-06-12 DIAGNOSIS — R0609 Other forms of dyspnea: Secondary | ICD-10-CM

## 2021-06-12 DIAGNOSIS — U071 COVID-19: Secondary | ICD-10-CM

## 2021-06-12 DIAGNOSIS — E781 Pure hyperglyceridemia: Secondary | ICD-10-CM

## 2021-06-12 DIAGNOSIS — D582 Other hemoglobinopathies: Secondary | ICD-10-CM

## 2021-06-12 LAB — PRO B NATRIURETIC PEPTIDE: NT-Pro BNP: 46 pg/mL (ref 0–210)

## 2021-06-12 NOTE — Progress Notes (Signed)
See lab medicine recent visit.  Repeat CBC, triglycerides, and D-dimer with dyspnea after COVID infection.

## 2021-06-12 NOTE — Telephone Encounter (Signed)
Noted  

## 2021-06-23 DIAGNOSIS — F4323 Adjustment disorder with mixed anxiety and depressed mood: Secondary | ICD-10-CM | POA: Diagnosis not present

## 2021-06-27 ENCOUNTER — Ambulatory Visit: Payer: BC Managed Care – PPO | Admitting: Family Medicine

## 2021-06-27 ENCOUNTER — Encounter: Payer: Self-pay | Admitting: Family Medicine

## 2021-06-27 VITALS — BP 128/80 | HR 82 | Temp 98.2°F | Resp 17 | Ht 74.0 in | Wt 235.6 lb

## 2021-06-27 DIAGNOSIS — E781 Pure hyperglyceridemia: Secondary | ICD-10-CM | POA: Diagnosis not present

## 2021-06-27 DIAGNOSIS — D582 Other hemoglobinopathies: Secondary | ICD-10-CM

## 2021-06-27 DIAGNOSIS — R0609 Other forms of dyspnea: Secondary | ICD-10-CM | POA: Diagnosis not present

## 2021-06-27 DIAGNOSIS — R251 Tremor, unspecified: Secondary | ICD-10-CM | POA: Diagnosis not present

## 2021-06-27 NOTE — Progress Notes (Signed)
Subjective:  Patient ID: Paul Rich, male    DOB: 25-Jan-1961  Age: 61 y.o. MRN: [id number]  CC:  Chief Complaint  Patient presents with   Fatigue    Pt reports fatigue issues have resolved no concerns per pt but wanted to do follow up     HPI Paul Rich presents for   Fatigue Follow-up from January 9.  COVID infection in December.  Treated with antiviral.  Some persistent dyspnea on exertion, was feeling winded faster, fatigue with less activity.  Minimal cough with COVID but sporadic at last visit.  Also noticed some hand tremors after hard workouts in the previous few months. Caffeine intake discussed.  TSH normal, WBC 10.4, improved from previous readings at his last visit.  Hemoglobin was elevated at 19.3.  Recommended discussing with his provider for testosterone as blood donation may be needed or adjustment of doses. Plan for D-dimer, repeat CBC, repeat lipid panel at lab visit due to significant elevated triglycerides.  Has not had repeat labs performed. Feels back to normal, no CP, no dyspnea. Normal workouts. No new sx's. Unknown last testosterone, self decreased dose of testosterone in half - half amount, still injecting weekly. Feeling great. Hand tremor resolved. Slight decreased caffeine. +  Hyperlipidemia: Treated with pravastatin 20 mg daily, Lovaza 2 g 2 times daily.  No abd pain, nausea, vomiting. Not fasting last visit. Some missed doses of lovaza and pravastatin - maybe every other day? Lab Results  Component Value Date   CHOL 270 (H) 06/11/2021   HDL 33.20 (L) 06/11/2021   LDLCALC 92 02/29/2016   LDLDIRECT 91.0 06/11/2021   TRIG (H) 06/11/2021    958.0 Triglyceride is over 400; calculations on Lipids are invalid.   CHOLHDL 8 06/11/2021   Lab Results  Component Value Date   ALT 40 06/11/2021   AST 28 06/11/2021   ALKPHOS 48 06/11/2021   BILITOT 0.8 06/11/2021    History Patient Active Problem List   Diagnosis Date Noted   COVID-19 05/14/2021    SBO (small bowel obstruction) ([place]) 08/04/2019   Testosterone deficiency in male 08/04/2019   Allergic rhinitis due to other allergen 05/24/2011   Allergy to shellfish 05/24/2011   OSA (obstructive sleep apnea) 05/21/2011   HEMORRHOIDS 10/12/2008   COLONIC POLYPS 08/29/2008   Dyslipidemia 08/29/2008   RECTAL BLEEDING 08/29/2008   Past Medical History:  Diagnosis Date   Allergy    pollen   AV block    Childhood asthma    Environmental allergies    Hemorrhage of rectum and anus    Other and unspecified hyperlipidemia    SBO (small bowel obstruction) ([place]) 08/2019   Sleep apnea    stopped using cpap years ago   Testosterone deficiency in male 08/04/2019   Past Surgical History:  Procedure Laterality Date   COLONOSCOPY     HERNIA REPAIR  06/03/2010   inguinal and umbilical   INGUINAL HERNIA REPAIR     in high school   POLYPECTOMY     WISDOM TOOTH EXTRACTION     age 80   Allergies  Allergen Reactions   Shellfish Allergy Anaphylaxis and Swelling    Of swelling of eyes   Other Hives   Mold Extract [Trichophyton]    Prior to Admission medications   Medication Sig Start Date End Date Taking? Authorizing Provider  aspirin 81 MG tablet Take 81 mg by mouth daily.   Yes [provider]  Multiple Vitamin (MULTIVITAMIN) tablet Take 1 tablet by  mouth daily.   Yes [provider]  niacinamide 500 MG tablet Take 500 mg by mouth 2 (two) times daily with a meal.   Yes [provider]  omega-3 acid ethyl esters (LOVAZA) 1 g capsule Take 2 capsules (2 g total) by mouth 2 (two) times daily. 11/30/20  Yes Wendie Agreste, MD  OVER THE COUNTER MEDICATION Take 1 capsule by mouth daily. Nitric Oxide   Yes [provider]  OVER THE COUNTER MEDICATION Take 1 capsule by mouth daily. Vitamin A & D Combination   Yes [provider]  pravastatin (PRAVACHOL) 20 MG tablet Take 1 tablet (20 mg total) by mouth daily. 06/11/21  Yes Wendie Agreste, MD   Pregnenolone Micronized (PREGNENOLONE PO) Take 1 capsule by mouth daily.   Yes [provider]  PRESCRIPTION MEDICATION HCG injectable taking 2 ml  Two times weekly   Yes [provider]  RESVERATROL PO Take 1 capsule by mouth daily.   Yes [provider]  tadalafil (CIALIS) 5 MG tablet Take 1 tablet (5 mg total) by mouth daily as needed for erectile dysfunction. 06/11/21  Yes Wendie Agreste, MD  testosterone cypionate (DEPOTESTOSTERONE CYPIONATE) 200 MG/ML injection Inject 90 mg into the muscle every 7 (seven) days. 0.45 mL Twice a week   Yes [provider]  triamcinolone cream (KENALOG) 0.1 % Apply topically 2 (two) times daily. 05/06/21  Yes [provider]  valACYclovir (VALTREX) 1000 MG tablet Take 1,000 mg by mouth 2 (two) times daily. 05/06/21  Yes [provider]   Social History   Socioeconomic History   Marital status: Married    Spouse name: Not on file   Number of children: Not on file   Years of education: Not on file   Highest education level: Not on file  Occupational History   Occupation: HR exec-business  Tobacco Use   Smoking status: Never   Smokeless tobacco: Never  Vaping Use   Vaping Use: Never used  Substance and Sexual Activity   Alcohol use: Yes    Alcohol/week: 3.0 - 4.0 standard drinks    Types: 3 - 4 Shots of liquor per week    Comment: occ red wine and beer but usually burbon   Drug use: No   Sexual activity: Yes  Other Topics Concern   Not on file  Social History Narrative   Divorced   Exercise: Tes, 4 times a week   Education: PHD   Social Determinants of Radio broadcast assistant Strain: Not on file  Food Insecurity: Not on file  Transportation Needs: Not on file  Physical Activity: Not on file  Stress: Not on file  Social Connections: Not on file  Intimate Partner Violence: Not on file    Review of Systems Per HPI.   Objective:   Vitals:   06/27/21 0908  BP: 128/80  Pulse:  82  Resp: 17  Temp: 98.2 F (36.8 C)  TempSrc: Temporal  SpO2: 96%  Weight: 235 lb 9.6 oz (106.9 kg)  Height: 6\' 2"  (1.88 m)     Physical Exam Vitals reviewed.  Constitutional:      Appearance: He is well-developed.  HENT:     Head: Normocephalic and atraumatic.  Neck:     Vascular: No carotid bruit or JVD.  Cardiovascular:     Rate and Rhythm: Normal rate and regular rhythm.     Heart sounds: Normal heart sounds. No murmur heard. Pulmonary:  Effort: Pulmonary effort is normal.     Breath sounds: Normal breath sounds. No rales.  Musculoskeletal:     Right lower leg: No edema.     Left lower leg: No edema.  Skin:    General: Skin is warm and dry.  Neurological:     Mental Status: He is alert and oriented to person, place, and time.  Psychiatric:        Mood and Affect: Mood normal.    Assessment & Plan:  Paul Rich is a 61 y.o. male . Elevated hemoglobin (HCC)  - ? Relation to testosterone. Repeat levels next 2 weeks with lab visit.   DOE (dyspnea on exertion)  - resolved. Likley slow return to baseline after covid infection.   Hypertriglyceridemia  -Possibly related to not fasting day of labs as well as intermittent dosing of meds.  Could potentially be impacted by his testosterone, as well as some diet impact.  Denies nausea/vomiting/abdominal pain.  Potential risks of significant hypertriglyceridemia discussed.  Plan for improved adherence, and fasting lab recheck in the next 10 to 14 days.  Also plans on some dietary improvements.  Consider lipid specialist eval if persistent elevated readings on meds.  Tremor of both hands  - resolved -suspect caffeine component previously.  RTC precautions if persists/returns.   No orders of the defined types were placed in this encounter.  Patient Instructions  Glad to hear the fatigue is better.  Make sure to take pravastatin daily, lovaza consistently and return for lab visit in next 10-14 days. Depending on  readings, may need to adjust meds or discuss with lipid specialist. I will let you know  Follow up with [facility] for updated levels including testosterone levels. I will repeat blood counts with lab visit.  Thanks for coming in today and let me know if there are questions.    Signed,   Merri Ray, MD [place], [facility] 06/27/21 9:41 AM

## 2021-06-27 NOTE — Patient Instructions (Addendum)
Glad to hear the fatigue is better.  Make sure to take pravastatin daily, lovaza consistently and return for lab visit in next 10-14 days. Depending on readings, may need to adjust meds or discuss with lipid specialist. I will let you know  Follow up with [facility] for updated levels including testosterone levels. I will repeat blood counts with lab visit.  Thanks for coming in today and let me know if there are questions.

## 2021-06-30 DIAGNOSIS — F4323 Adjustment disorder with mixed anxiety and depressed mood: Secondary | ICD-10-CM | POA: Diagnosis not present

## 2021-07-07 DIAGNOSIS — J0140 Acute pansinusitis, unspecified: Secondary | ICD-10-CM | POA: Diagnosis not present

## 2021-07-13 ENCOUNTER — Other Ambulatory Visit (INDEPENDENT_AMBULATORY_CARE_PROVIDER_SITE_OTHER): Payer: BC Managed Care – PPO

## 2021-07-13 ENCOUNTER — Telehealth: Payer: Self-pay

## 2021-07-13 DIAGNOSIS — R0609 Other forms of dyspnea: Secondary | ICD-10-CM

## 2021-07-13 DIAGNOSIS — E781 Pure hyperglyceridemia: Secondary | ICD-10-CM | POA: Diagnosis not present

## 2021-07-13 DIAGNOSIS — U071 COVID-19: Secondary | ICD-10-CM

## 2021-07-13 DIAGNOSIS — D582 Other hemoglobinopathies: Secondary | ICD-10-CM

## 2021-07-13 LAB — LIPID PANEL
Cholesterol: 181 mg/dL (ref 0–200)
HDL: 34 mg/dL — ABNORMAL LOW (ref >39.00)
NonHDL: 146.76
Total CHOL/HDL Ratio: 5
Triglycerides: 326 mg/dL — ABNORMAL HIGH (ref 0.0–149.0)
VLDL: 65.2 mg/dL — ABNORMAL HIGH (ref 0.0–40.0)

## 2021-07-13 LAB — CBC
HCT: 56.6 % — ABNORMAL HIGH (ref 39.0–52.0)
Hemoglobin: 19 g/dL (ref 13.0–17.0)
MCHC: 33.6 g/dL (ref 30.0–36.0)
MCV: 89.3 fl (ref 78.0–100.0)
Platelets: 178 10*3/uL (ref 150.0–400.0)
RBC: 6.34 Mil/uL — ABNORMAL HIGH (ref 4.22–5.81)
RDW: 13.8 % (ref 11.5–15.5)
WBC: 7.7 10*3/uL (ref 4.0–10.5)

## 2021-07-13 LAB — LDL CHOLESTEROL, DIRECT: Direct LDL: 96 mg/dL

## 2021-07-13 NOTE — Telephone Encounter (Signed)
Lab called with critical value on pts Hemoglobin came back as 19.0

## 2021-07-13 NOTE — Telephone Encounter (Signed)
Noted  

## 2021-07-14 LAB — D-DIMER, QUANTITATIVE: D-Dimer, Quant: 0.68 mcg/mL FEU — ABNORMAL HIGH (ref <0.50)

## 2021-07-21 DIAGNOSIS — F4323 Adjustment disorder with mixed anxiety and depressed mood: Secondary | ICD-10-CM | POA: Diagnosis not present

## 2021-08-11 DIAGNOSIS — F4323 Adjustment disorder with mixed anxiety and depressed mood: Secondary | ICD-10-CM | POA: Diagnosis not present

## 2021-08-25 DIAGNOSIS — F4323 Adjustment disorder with mixed anxiety and depressed mood: Secondary | ICD-10-CM | POA: Diagnosis not present

## 2021-08-31 DIAGNOSIS — F4323 Adjustment disorder with mixed anxiety and depressed mood: Secondary | ICD-10-CM | POA: Diagnosis not present

## 2021-09-13 DIAGNOSIS — F4323 Adjustment disorder with mixed anxiety and depressed mood: Secondary | ICD-10-CM | POA: Diagnosis not present

## 2021-09-29 DIAGNOSIS — F4323 Adjustment disorder with mixed anxiety and depressed mood: Secondary | ICD-10-CM | POA: Diagnosis not present

## 2021-10-04 DIAGNOSIS — R062 Wheezing: Secondary | ICD-10-CM | POA: Diagnosis not present

## 2021-10-04 DIAGNOSIS — J018 Other acute sinusitis: Secondary | ICD-10-CM | POA: Diagnosis not present

## 2021-10-04 DIAGNOSIS — R051 Acute cough: Secondary | ICD-10-CM | POA: Diagnosis not present

## 2021-10-16 DIAGNOSIS — F4323 Adjustment disorder with mixed anxiety and depressed mood: Secondary | ICD-10-CM | POA: Diagnosis not present

## 2021-11-10 DIAGNOSIS — F4323 Adjustment disorder with mixed anxiety and depressed mood: Secondary | ICD-10-CM | POA: Diagnosis not present

## 2021-11-26 ENCOUNTER — Other Ambulatory Visit: Payer: Self-pay | Admitting: Family Medicine

## 2021-11-26 DIAGNOSIS — E785 Hyperlipidemia, unspecified: Secondary | ICD-10-CM

## 2021-12-08 DIAGNOSIS — F4323 Adjustment disorder with mixed anxiety and depressed mood: Secondary | ICD-10-CM | POA: Diagnosis not present

## 2022-01-05 DIAGNOSIS — F4323 Adjustment disorder with mixed anxiety and depressed mood: Secondary | ICD-10-CM | POA: Diagnosis not present

## 2022-01-19 DIAGNOSIS — F4323 Adjustment disorder with mixed anxiety and depressed mood: Secondary | ICD-10-CM | POA: Diagnosis not present

## 2022-02-23 DIAGNOSIS — F4323 Adjustment disorder with mixed anxiety and depressed mood: Secondary | ICD-10-CM | POA: Diagnosis not present

## 2022-03-08 ENCOUNTER — Ambulatory Visit: Payer: BC Managed Care – PPO | Admitting: Family Medicine

## 2022-03-08 ENCOUNTER — Encounter: Payer: Self-pay | Admitting: Family Medicine

## 2022-03-08 VITALS — BP 134/80 | HR 68 | Temp 98.1°F | Ht 74.0 in | Wt 239.0 lb

## 2022-03-08 DIAGNOSIS — N481 Balanitis: Secondary | ICD-10-CM

## 2022-03-08 DIAGNOSIS — M25511 Pain in right shoulder: Secondary | ICD-10-CM | POA: Diagnosis not present

## 2022-03-08 MED ORDER — FLUCONAZOLE 150 MG PO TABS: 150.0000 mg | ORAL_TABLET | Freq: Every day | ORAL | 0 refills | Status: DC

## 2022-03-08 MED ORDER — MICONAZOLE NITRATE 2 % EX CREA: 1.0000 | TOPICAL_CREAM | Freq: Two times a day (BID) | CUTANEOUS | 0 refills | Status: DC

## 2022-03-08 NOTE — Patient Instructions (Addendum)
Rash on penis appears to be fungal infection, see information below.  Take Diflucan once, then miconazole cream twice per day until rash improves, can use for a few days afterwards to make sure it is completely resolved. If not improving with this treatment or any worsening, return for recheck.  Shoulder pain could be strain or pain originating from deltoid.  Less likely rotator cuff based on exam today.  Continue activity modification with lower weights as tolerated, topical Voltaren gel sometimes can be helpful as well.  That is over-the-counter and can be used instead of ibuprofen if that is effective.  Let me know if that does not continue to improve in the next few weeks and I am happy to refer you to Dr. Mardelle Matte at [facility] orthopedics.  Take care.

## 2022-03-08 NOTE — Progress Notes (Signed)
Subjective:  Patient ID: Paul Rich, male    DOB: 1961/05/08  Age: 61 y.o. MRN: [id number]  CC:  Chief Complaint  Patient presents with   Rash    Rash on penis, pt treated it with lotrimin went away and came back   Shoulder Pain    Pt states he has had shoulder pain  for 2 months , rt shoulder pt lifts weights can lift lighter weights with no problem but when he goes up its painful     HPI Paul Rich presents for   2 new concerns as above.  Genital rash Noticed few weeks ago, wife had yeast infection at that time. Noticed discoloration on tip of penis. Wife was treated, he treated with over-the-counter Lotrimin with some improvement after few days, then returned 1 week ago. Tried lotrimin for few days - slight wrinkled appearance. Stopped use. No penile d/c, no blisters, no new sexual contacts.  No current treatment.   Right shoulder pain Past 2 months. Regular exercise - 3 days lifting every day. Sometimes combines upper body days.  Lever bench press - noticed ache. No pop or acute injury. Some slight ache, including overnight if sleeping on that side. Adjusted weights - still free bench press, but lower weight. No pain with lower weight.  No limitations in motion. Notices on bench press, dumbbell laterals.  No bruising, swelling.  Tx: ibuprofen on occasion helps. Ice at times helps.  No prior shoulder surgery. R hand dominant.     History Patient Active Problem List   Diagnosis Date Noted   COVID-19 05/14/2021   SBO (small bowel obstruction) ([place]) 08/04/2019   Testosterone deficiency in male 08/04/2019   Allergic rhinitis due to other allergen 05/24/2011   Allergy to shellfish 05/24/2011   OSA (obstructive sleep apnea) 05/21/2011   HEMORRHOIDS 10/12/2008   COLONIC POLYPS 08/29/2008   Dyslipidemia 08/29/2008   RECTAL BLEEDING 08/29/2008   Past Medical History:  Diagnosis Date   Allergy    pollen   AV block    Childhood asthma    Environmental allergies     Hemorrhage of rectum and anus    Other and unspecified hyperlipidemia    SBO (small bowel obstruction) ([place]) 08/2019   Sleep apnea    stopped using cpap years ago   Testosterone deficiency in male 08/04/2019   Past Surgical History:  Procedure Laterality Date   COLONOSCOPY     HERNIA REPAIR  06/03/2010   inguinal and umbilical   INGUINAL HERNIA REPAIR     in high school   POLYPECTOMY     WISDOM TOOTH EXTRACTION     age 31   Allergies  Allergen Reactions   Shellfish Allergy Anaphylaxis and Swelling    Of swelling of eyes Of swelling of eyes Of swelling of eyes   Other Hives   Mold Extract [Trichophyton]    Prior to Admission medications   Medication Sig Start Date End Date Taking? Authorizing Provider  aspirin 81 MG tablet Take 81 mg by mouth daily.   Yes [provider]  Multiple Vitamin (MULTIVITAMIN) tablet Take 1 tablet by mouth daily.   Yes [provider]  niacinamide 500 MG tablet Take 500 mg by mouth 2 (two) times daily with a meal.   Yes [provider]  omega-3 acid ethyl esters (LOVAZA) 1 g capsule TAKE 2 CAPSULES TWICE A DAY 11/26/21  Yes Wendie Agreste, MD  OVER THE COUNTER MEDICATION Take 1 capsule by mouth daily. Nitric  Oxide   Yes [provider]  OVER THE COUNTER MEDICATION Take 1 capsule by mouth daily. Vitamin A & D Combination   Yes [provider]  pravastatin (PRAVACHOL) 20 MG tablet Take 1 tablet (20 mg total) by mouth daily. 06/11/21  Yes Wendie Agreste, MD  Pregnenolone Micronized (PREGNENOLONE PO) Take 1 capsule by mouth daily.   Yes [provider]  PRESCRIPTION MEDICATION HCG injectable taking 2 ml  Two times weekly   Yes [provider]  RESVERATROL PO Take 1 capsule by mouth daily.   Yes [provider]  tadalafil (CIALIS) 5 MG tablet Take 1 tablet (5 mg total) by mouth daily as needed for erectile dysfunction. 06/11/21  Yes Wendie Agreste, MD  testosterone cypionate  (DEPOTESTOSTERONE CYPIONATE) 200 MG/ML injection Inject 90 mg into the muscle every 7 (seven) days. 0.45 mL Twice a week   Yes [provider]  triamcinolone cream (KENALOG) 0.1 % Apply topically 2 (two) times daily. 05/06/21  Yes [provider]  valACYclovir (VALTREX) 1000 MG tablet Take 1,000 mg by mouth 2 (two) times daily. 05/06/21  Yes [provider]  Omega 3 1000 MG CAPS Take by mouth.    [provider]   Social History   Socioeconomic History   Marital status: Married    Spouse name: Not on file   Number of children: Not on file   Years of education: Not on file   Highest education level: Not on file  Occupational History   Occupation: HR exec-business  Tobacco Use   Smoking status: Never   Smokeless tobacco: Never  Vaping Use   Vaping Use: Never used  Substance and Sexual Activity   Alcohol use: Yes    Alcohol/week: 3.0 - 4.0 standard drinks of alcohol    Types: 3 - 4 Shots of liquor per week    Comment: occ red wine and beer but usually burbon   Drug use: No   Sexual activity: Yes  Other Topics Concern   Not on file  Social History Narrative   Divorced   Exercise: Tes, 4 times a week   Education: PHD   Social Determinants of Radio broadcast assistant Strain: Not on file  Food Insecurity: Not on file  Transportation Needs: Not on file  Physical Activity: Not on file  Stress: Not on file  Social Connections: Not on file  Intimate Partner Violence: Not on file    Review of Systems Per hpi.   Objective:   Vitals:   03/08/22 1056  BP: 134/80  Pulse: 68  Temp: 98.1 F (36.7 C)  SpO2: 95%  Weight: 239 lb (108.4 kg)  Height: '6\' 2"'$  (1.88 m)     Physical Exam Vitals reviewed.  Constitutional:      General: He is not in acute distress.    Appearance: Normal appearance. He is well-developed.  HENT:     Head: Normocephalic and atraumatic.  Cardiovascular:     Rate and Rhythm: Normal rate.  Pulmonary:      Effort: Pulmonary effort is normal.  Genitourinary:    Comments: Few small slightly hyperpigmented, erythematous patches on head of penis.  No vesicles.  No ulcerations.  No penile discharge.  Testicles nontender, no other appreciable groin rash.  Penile shaft uninvolved. Musculoskeletal:     Comments: C-spine, pain-free range of motion, does not reproduce shoulder symptoms. Omega, AC, clavicle nontender Right shoulder full range of motion, full rotator cuff strength, minimal discomfort at  the lateral deltoid with empty can testing but no weakness.  Negative Neer, Brayton Layman.  Minimal discomfort with resistance of deltoid/abduction.  No focal deficit of deltoid but describes area of discomfort at the mid proximal deltoid.  No bony tenderness.  Neurovascular intact distally.  Neurological:     Mental Status: He is alert and oriented to person, place, and time.  Psychiatric:        Mood and Affect: Mood normal.        Assessment & Plan:  Paul Rich is a 61 y.o. male . Balanitis - Plan: fluconazole (DIFLUCAN) 150 MG tablet, miconazole (MICOTIN) 2 % cream  -New concern.  Appears to be balanitis, likely candidal as prior improvement temporarily on clotrimazole.  No vesicles, no ulcerations.  No new sexual partners.  Deferred STI testing at this time.  We will initially treat with Diflucan 150 mg x 1 plus miconazole 2% twice daily until resolution, then continue few additional days.  RTC precautions if not improving to consider other causes, consider metronidazole for anaerobic treatment, but unlikely.  Right shoulder pain, unspecified chronicity  -Based on exam and without known specific injury, possible overuse or strain of deltoid without significant deficit or weakness appreciated.  Pain with sleeping on shoulder, rotator cuff tendinosis also possible but overall reassuring exam.  Activity modification discussed, continue lighter weights and avoidance of offending activities, range of  motion, stretching, especially after exercise.  Topical diclofenac for now.  No bony tenderness, imaging deferred at this time.  If not improving, would recommend follow-up with Ortho, has seen [facility] previously and will refer him to Dr. Mardelle Matte.  He will let me know.  Meds ordered this encounter  Medications   fluconazole (DIFLUCAN) 150 MG tablet    Sig: Take 1 tablet (150 mg total) by mouth daily.    Dispense:  1 tablet    Refill:  0   miconazole (MICOTIN) 2 % cream    Sig: Apply 1 Application topically 2 (two) times daily.    Dispense:  28.35 g    Refill:  0   Patient Instructions  Rash on penis appears to be fungal infection, see information below.  Take Diflucan once, then miconazole cream twice per day until rash improves, can use for a few days afterwards to make sure it is completely resolved. If not improving with this treatment or any worsening, return for recheck.  Shoulder pain could be strain or pain originating from deltoid.  Less likely rotator cuff based on exam today.  Continue activity modification with lower weights as tolerated, topical Voltaren gel sometimes can be helpful as well.  That is over-the-counter and can be used instead of ibuprofen if that is effective.  Let me know if that does not continue to improve in the next few weeks and I am happy to refer you to Dr. Mardelle Matte at [facility] orthoped[facility].  Take care.       Signed,   Merri Ray, MD [facility] 03/08/22 1:53 PM

## 2022-03-09 DIAGNOSIS — F4323 Adjustment disorder with mixed anxiety and depressed mood: Secondary | ICD-10-CM | POA: Diagnosis not present

## 2022-03-30 DIAGNOSIS — F4323 Adjustment disorder with mixed anxiety and depressed mood: Secondary | ICD-10-CM | POA: Diagnosis not present

## 2022-04-13 DIAGNOSIS — F4323 Adjustment disorder with mixed anxiety and depressed mood: Secondary | ICD-10-CM | POA: Diagnosis not present

## 2022-04-16 DIAGNOSIS — Z6829 Body mass index (BMI) 29.0-29.9, adult: Secondary | ICD-10-CM | POA: Diagnosis not present

## 2022-04-16 DIAGNOSIS — J029 Acute pharyngitis, unspecified: Secondary | ICD-10-CM | POA: Diagnosis not present

## 2022-05-04 DIAGNOSIS — F4323 Adjustment disorder with mixed anxiety and depressed mood: Secondary | ICD-10-CM | POA: Diagnosis not present

## 2022-05-16 ENCOUNTER — Other Ambulatory Visit: Payer: Self-pay | Admitting: Family Medicine

## 2022-05-16 DIAGNOSIS — E785 Hyperlipidemia, unspecified: Secondary | ICD-10-CM

## 2022-06-08 DIAGNOSIS — F4323 Adjustment disorder with mixed anxiety and depressed mood: Secondary | ICD-10-CM | POA: Diagnosis not present

## 2022-06-22 DIAGNOSIS — F4323 Adjustment disorder with mixed anxiety and depressed mood: Secondary | ICD-10-CM | POA: Diagnosis not present

## 2022-07-06 DIAGNOSIS — F4323 Adjustment disorder with mixed anxiety and depressed mood: Secondary | ICD-10-CM | POA: Diagnosis not present

## 2022-07-27 DIAGNOSIS — F4323 Adjustment disorder with mixed anxiety and depressed mood: Secondary | ICD-10-CM | POA: Diagnosis not present

## 2022-08-17 DIAGNOSIS — F4323 Adjustment disorder with mixed anxiety and depressed mood: Secondary | ICD-10-CM | POA: Diagnosis not present

## 2022-09-14 DIAGNOSIS — F4323 Adjustment disorder with mixed anxiety and depressed mood: Secondary | ICD-10-CM | POA: Diagnosis not present

## 2022-09-17 LAB — LAB REPORT - SCANNED
A1c: 5.9
EGFR: 66

## 2022-10-31 ENCOUNTER — Encounter: Payer: Self-pay | Admitting: Family Medicine

## 2022-10-31 ENCOUNTER — Other Ambulatory Visit (HOSPITAL_COMMUNITY)
Admission: RE | Admit: 2022-10-31 | Discharge: 2022-10-31 | Disposition: A | Discharge status: Home / Self Care | Payer: BC Managed Care – PPO | Source: Ambulatory Visit | Attending: Family Medicine | Admitting: Family Medicine

## 2022-10-31 ENCOUNTER — Ambulatory Visit: Payer: BC Managed Care – PPO | Admitting: Family Medicine

## 2022-10-31 VITALS — BP 124/70 | HR 68 | Temp 97.8°F | Ht 74.0 in | Wt 234.2 lb

## 2022-10-31 DIAGNOSIS — R21 Rash and other nonspecific skin eruption: Secondary | ICD-10-CM | POA: Diagnosis not present

## 2022-10-31 DIAGNOSIS — E875 Hyperkalemia: Secondary | ICD-10-CM

## 2022-10-31 DIAGNOSIS — R42 Dizziness and giddiness: Secondary | ICD-10-CM | POA: Diagnosis not present

## 2022-10-31 LAB — BASIC METABOLIC PANEL
BUN: 13 mg/dL (ref 6–23)
CO2: 28 mEq/L (ref 19–32)
Calcium: 9.9 mg/dL (ref 8.4–10.5)
Chloride: 100 mEq/L (ref 96–112)
Creatinine, Ser: 1.05 mg/dL (ref 0.40–1.50)
GFR: 76.89 mL/min (ref >60.00)
Glucose, Bld: 73 mg/dL (ref 70–99)
Potassium: 4.2 mEq/L (ref 3.5–5.1)
Sodium: 139 mEq/L (ref 135–145)

## 2022-10-31 LAB — CBC
HCT: 49.2 % (ref 39.0–52.0)
Hemoglobin: 15.4 g/dL (ref 13.0–17.0)
MCHC: 31.3 g/dL (ref 30.0–36.0)
MCV: 75.4 fl — ABNORMAL LOW (ref 78.0–100.0)
Platelets: 212 10*3/uL (ref 150.0–400.0)
RBC: 6.52 Mil/uL — ABNORMAL HIGH (ref 4.22–5.81)
RDW: 16.3 % — ABNORMAL HIGH (ref 11.5–15.5)
WBC: 7.1 10*3/uL (ref 4.0–10.5)

## 2022-10-31 NOTE — Progress Notes (Signed)
Subjective:  Patient ID: Paul Rich, male    DOB: 25-Jan-1961  Age: 62 y.o. MRN: [id number]  CC:  Chief Complaint  Patient presents with   Rash    Pt notes rash to his genital area starting about 2 weeks ago following sexual intercourse with wife    Dizziness    Notes lightheaded waking up on Sunday Monday but notes he did donate blood Friday but has not had them since notes felt like vertigo     HPI [name] presents for    Genital rash: Noted past 2 weeks, after intercourse.  Noticed dry/irritated with intercourse the once time. Took a few days off.  Married, monogamous with wife, no known exposure to STI or new partners. Looks like Corporate treasurer and sensitive now. Few bumps on base of penis. Partner and he have cold sores - no known hx of genital hsv. Has received oral sex, partner with cold sore few weeks prior, noit active at that time.  Tx: polysporin - few days, then miconazole, lotrimin past week - no change.  He was treated in October for balanitis with fluconazole 150 mg once and miconazole nitrate topical twice daily.  Lightheadedness/dizziness Noted earlier this week.  Donated blood last Friday. Usually donates every 8-10 weeks. Felt ok initially on Friday and Saturday. Noticed dizziness with movement with standing up Sunday morning - lasted few hours, then resolved. Felt spacy. Took ibuporfen once. Returned next morning for 1 hour, then resolved. Not since. Feeling ok now, exercising without issues. No CP/dyspnea with exercise.   No melena/hematochezia.  Last bloodwork at [facility] in April brought to the office today.  September 17, 2022, WBC 6.3, hemoglobin 16.0, platelets 197.  Normal differential.  Potassium 5.8, phosphorus 6.6 but otherwise chemistry normal.  No current potassium supplement.  Iron was slightly low at 38, ferritin 8.9, folate greater than 24.  Triglycerides 423, total cholesterol 185, LDL 85.  Vitamin D 35.  TSH 1.5 PSA 0.7    History Patient  Active Problem List   Diagnosis Date Noted   COVID-19 05/14/2021   SBO (small bowel obstruction) ([facility]) 08/04/2019   Testosterone deficiency in male 08/04/2019   Allergic rhinitis due to other allergen 05/24/2011   Allergy to shellfish 05/24/2011   OSA (obstructive sleep apnea) 05/21/2011   HEMORRHOIDS 10/12/2008   COLONIC POLYPS 08/29/2008   Dyslipidemia 08/29/2008   RECTAL BLEEDING 08/29/2008   Past Medical History:  Diagnosis Date   Allergy    pollen   AV block    Childhood asthma    Environmental allergies    Hemorrhage of rectum and anus    Other and unspecified hyperlipidemia    SBO (small bowel obstruction) ([facility]) 08/2019   Sleep apnea    stopped using cpap years ago   Testosterone deficiency in male 08/04/2019   Past Surgical History:  Procedure Laterality Date   COLONOSCOPY     HERNIA REPAIR  06/03/2010   inguinal and umbilical   INGUINAL HERNIA REPAIR     in high school   POLYPECTOMY     WISDOM TOOTH EXTRACTION     age 55   Allergies  Allergen Reactions   Shellfish Allergy Anaphylaxis and Swelling    Of swelling of eyes Of swelling of eyes Of swelling of eyes   Other Hives   Mold Extract [Trichophyton]    Prior to Admission medications   Medication Sig Start Date End Date Taking? Authorizing Provider  aspirin 81 MG tablet Take 81 mg  by mouth daily.   Yes [provider]  miconazole (MICOTIN) 2 % cream Apply 1 Application topically 2 (two) times daily. 03/08/22  Yes Shade Flood, MD  Multiple Vitamin (MULTIVITAMIN) tablet Take 1 tablet by mouth daily.   Yes [provider]  niacinamide 500 MG tablet Take 500 mg by mouth 2 (two) times daily with a meal.   Yes [provider]  Omega 3 1000 MG CAPS Take by mouth.   Yes [provider]  omega-3 acid ethyl esters (LOVAZA) 1 g capsule TAKE 2 CAPSULES TWICE A DAY 11/26/21  Yes Shade Flood, MD  OVER THE COUNTER MEDICATION Take 1 capsule by mouth daily. Nitric Oxide    Yes [provider]  OVER THE COUNTER MEDICATION Take 1 capsule by mouth daily. Vitamin A & D Combination   Yes [provider]  pravastatin (PRAVACHOL) 20 MG tablet TAKE 1 TABLET DAILY 05/16/22  Yes Shade Flood, MD  Pregnenolone Micronized (PREGNENOLONE PO) Take 1 capsule by mouth daily.   Yes [provider]  PRESCRIPTION MEDICATION HCG injectable taking 2 ml  Two times weekly   Yes [provider]  RESVERATROL PO Take 1 capsule by mouth daily.   Yes [provider]  tadalafil (CIALIS) 5 MG tablet Take 1 tablet (5 mg total) by mouth daily as needed for erectile dysfunction. 06/11/21  Yes Shade Flood, MD  testosterone cypionate (DEPOTESTOSTERONE CYPIONATE) 200 MG/ML injection Inject 90 mg into the muscle every 7 (seven) days. 0.45 mL Twice a week   Yes [provider]   Social History   Socioeconomic History   Marital status: Married    Spouse name: Not on file   Number of children: Not on file   Years of education: Not on file   Highest education level: Doctorate  Occupational History   Occupation: HR exec-business  Tobacco Use   Smoking status: Never   Smokeless tobacco: Never  Vaping Use   Vaping Use: Never used  Substance and Sexual Activity   Alcohol use: Yes    Alcohol/week: 3.0 - 4.0 standard drinks of alcohol    Types: 3 - 4 Shots of liquor per week    Comment: occ red wine and beer but usually burbon   Drug use: No   Sexual activity: Yes  Other Topics Concern   Not on file  Social History Narrative   Divorced   Exercise: Tes, 4 times a week   Education: PHD   Social Determinants of Health   Financial Resource Strain: Low Risk  (10/30/2022)   Overall Financial Resource Strain (CARDIA)    Difficulty of Paying Living Expenses: Not hard at all  Food Insecurity: No Food Insecurity (10/30/2022)   Hunger Vital Sign    Worried About Running Out of Food in the Last Year: Never true    Ran Out of Food in the  Last Year: Never true  Transportation Needs: No Transportation Needs (10/30/2022)   PRAPARE - Administrator, Civil Service (Medical): No    Lack of Transportation (Non-Medical): No  Physical Activity: Sufficiently Active (10/30/2022)   Exercise Vital Sign    Days of Exercise per Week: 4 days    Minutes of Exercise per Session: 60 min  Stress: No Stress Concern Present (10/30/2022)   [employer] of Occupational Health - Occupational Stress Questionnaire    Feeling of Stress : Only a little  Social Connections: Socially Integrated (10/30/2022)   Social Connection  and Isolation Panel [NHANES]    Frequency of Communication with Friends and Family: Three times a week    Frequency of Social Gatherings with Friends and Family: Once a week    Attends Religious Services: 1 to 4 times per year    Active Member of [employer] or Organizations: Yes    Attends Engineer, structural: More than 4 times per year    Marital Status: Married  Catering manager Violence: Not on file    Review of Systems   Objective:   Vitals:   10/31/22 1106  BP: 124/70  Pulse: 68  Temp: 97.8 F (36.6 C)  SpO2: 97%  Weight: 234 lb 3.2 oz (106.2 kg)  Height: 6\' 2"  (1.88 m)     Physical Exam Vitals reviewed.  Constitutional:      Appearance: He is well-developed.  HENT:     Head: Normocephalic and atraumatic.  Neck:     Vascular: No carotid bruit or JVD.  Cardiovascular:     Rate and Rhythm: Regular rhythm. Bradycardia present.     Heart sounds: Normal heart sounds. No murmur heard. Pulmonary:     Effort: Pulmonary effort is normal.     Breath sounds: Normal breath sounds. No rales.  Genitourinary:    Comments: Slight irritated/reddened area with faint scab distal left sided penile shaft with few small papules at the base of the penis and just proximal skin.  No vesicles.  No significant surrounding erythema.  No appreciable ulcerations. Musculoskeletal:     Right lower leg: No  edema.     Left lower leg: No edema.  Skin:    General: Skin is warm and dry.  Neurological:     Mental Status: He is alert and oriented to person, place, and time.  Psychiatric:        Mood and Affect: Mood normal.        Assessment & Plan:  Aundrey Ehrhardt is a 62 y.o. male . Vertigo - Plan: CBC, Basic metabolic panel Lightheadedness - Plan: CBC, Basic metabolic panel  -Brief episode of vertigo symptoms for 2 days as above, quickly resolved, no recent recurrence, reassuring exam at present, and has been able to exercise without recurrence of symptoms.  Question vertigo, middle ear source, less likely orthostasis.  Did donate blood recently but had not felt symptoms soon after having blood donation.  Check CBC, BMP, RTC/ER precautions if symptoms recur, with option of over-the-counter meclizine if mild symptoms.  Hyperkalemia - Plan: Basic metabolic panel  -Noted on recent labs, repeat on BMP  Rash of genital area - Plan: Urine cytology ancillary only, HSV(herpes simplex vrs) 1+2 ab-IgG, RPR, HIV Antibody (routine testing w rflx)  -Possible abrasion/irritation versus HSV versus less likely fungal.  Okay to continue topical antifungals, check STI screening as above although there are no known risk factors, monogamous.  RTC precautions.  Hold on antivirals for now given timing of symptoms.  No orders of the defined types were placed in this encounter.  Patient Instructions  Glad to hear that the dizziness or vertigo symptoms have improved from Sunday and Monday.  See information below on vertigo.  If that occurs again we can look into other causes.  Meclizine or Dramamine nondrowsy over-the-counter can be helpful if that were to happen again.  Continue to stay hydrated I will let you know if there are any concerns on labs.  Rash could be initial irritation from intercourse or possible viral rash from cold sore virus.  Based  on the timing of that rash I do not think an antiviral will be  helpful at this time.  Okay to continue either the Polysporin or the antifungal ointment, both would not hurt.  Mild soap like Dove and give me an update in the next 1 week.  I expect that to be improving but if not can look into other treatments.  If any concerns on the testing from today I will let you know.  Take care!    Signed,   Meredith Staggers, MD [facility], [facility] 10/31/22 11:30 AM

## 2022-10-31 NOTE — Patient Instructions (Signed)
Glad to hear that the dizziness or vertigo symptoms have improved from Sunday and Monday.  See information below on vertigo.  If that occurs again we can look into other causes.  Meclizine or Dramamine nondrowsy over-the-counter can be helpful if that were to happen again.  Continue to stay hydrated I will let you know if there are any concerns on labs.  Rash could be initial irritation from intercourse or possible viral rash from cold sore virus.  Based on the timing of that rash I do not think an antiviral will be helpful at this time.  Okay to continue either the Polysporin or the antifungal ointment, both would not hurt.  Mild soap like Dove and give me an update in the next 1 week.  I expect that to be improving but if not can look into other treatments.  If any concerns on the testing from today I will let you know.  Take care!

## 2022-11-01 LAB — URINE CYTOLOGY ANCILLARY ONLY
Chlamydia: NEGATIVE
Comment: NEGATIVE
Comment: NEGATIVE
Comment: NORMAL
Neisseria Gonorrhea: NEGATIVE
Trichomonas: NEGATIVE

## 2022-11-01 LAB — HSV(HERPES SIMPLEX VRS) I + II AB-IGG
HAV 1 IGG,TYPE SPECIFIC AB: 46 index — ABNORMAL HIGH
HSV 2 IGG,TYPE SPECIFIC AB: 4.36 index — ABNORMAL HIGH

## 2022-11-01 LAB — RPR: RPR Ser Ql: NONREACTIVE

## 2022-11-01 LAB — HIV ANTIBODY (ROUTINE TESTING W REFLEX): HIV 1&2 Ab, 4th Generation: NONREACTIVE

## 2022-11-02 DIAGNOSIS — F4323 Adjustment disorder with mixed anxiety and depressed mood: Secondary | ICD-10-CM | POA: Diagnosis not present

## 2022-11-08 ENCOUNTER — Other Ambulatory Visit: Payer: Self-pay | Admitting: Family Medicine

## 2022-11-08 DIAGNOSIS — R894 Abnormal immunological findings in specimens from other organs, systems and tissues: Secondary | ICD-10-CM

## 2022-11-08 MED ORDER — VALACYCLOVIR HCL 500 MG PO TABS
500.0000 mg | ORAL_TABLET | Freq: Two times a day (BID) | ORAL | 2 refills | Status: DC
Start: 2022-11-08 — End: 2024-03-06

## 2022-11-08 NOTE — Progress Notes (Signed)
See lab results.  

## 2022-11-20 ENCOUNTER — Other Ambulatory Visit: Payer: Self-pay | Admitting: Family Medicine

## 2022-11-20 DIAGNOSIS — E785 Hyperlipidemia, unspecified: Secondary | ICD-10-CM

## 2022-11-28 ENCOUNTER — Other Ambulatory Visit (HOSPITAL_COMMUNITY): Payer: Self-pay

## 2022-11-28 ENCOUNTER — Telehealth: Payer: Self-pay

## 2022-11-28 NOTE — Telephone Encounter (Signed)
I have informed pt of the approval  

## 2022-11-28 NOTE — Telephone Encounter (Signed)
Patient Advocate Encounter  Prior Authorization for Omega-3 1gm caps has been approved with [employer].    PA# [id number] Effective dates: 10/28/22 through 11/27/23  Approval letter indexed to chart.

## 2022-12-14 DIAGNOSIS — F4323 Adjustment disorder with mixed anxiety and depressed mood: Secondary | ICD-10-CM | POA: Diagnosis not present

## 2023-01-08 IMAGING — DX DG CHEST 2V
2 series · 2 of 2 positions shown · non-contrast
Comparison: None.

CLINICAL DATA: Dyspnea on exertion post COVID infection in
[REDACTED].

EXAM:
CHEST - 2 VIEW

[chest pa]
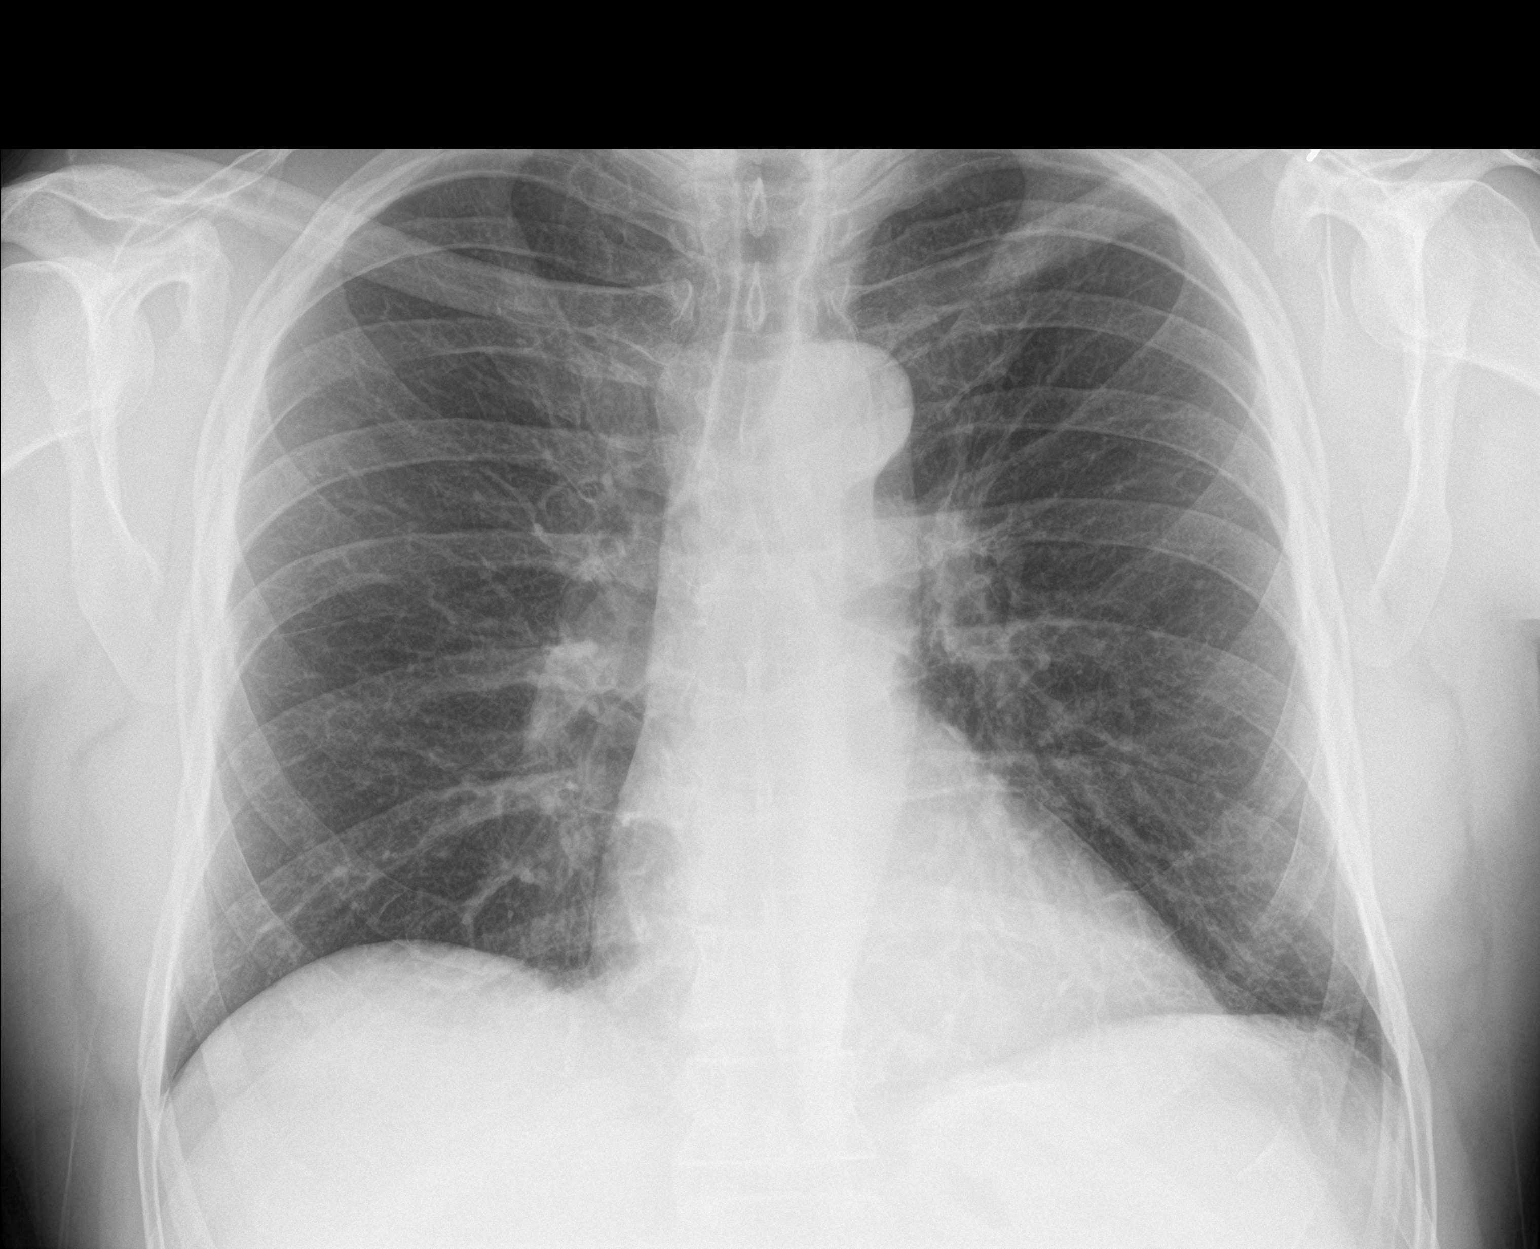

[chest lat]
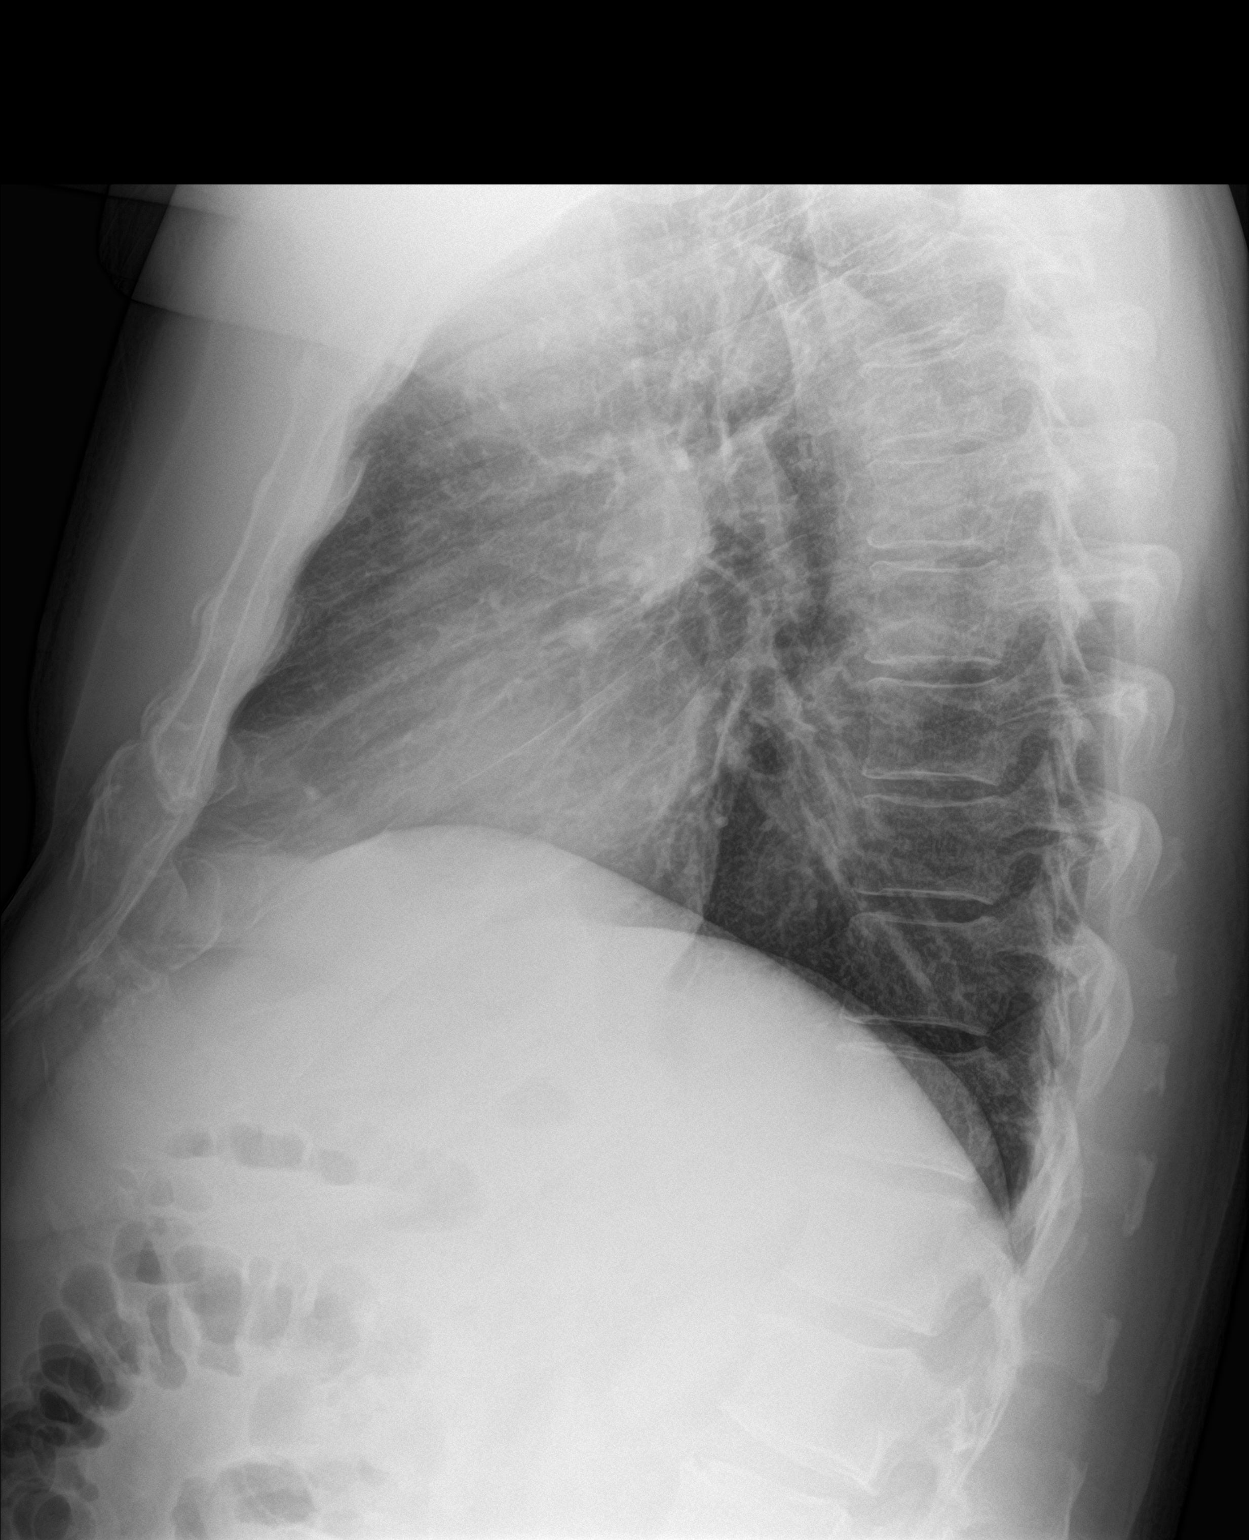

[2 of 2 positions shown; findings below may reference images not displayed]

FINDINGS: The heart size and mediastinal contours are within normal limits.
Both lungs are clear. No pleural effusion or pneumothorax. The
visualized skeletal structures are unremarkable.
IMPRESSION: No active cardiopulmonary disease.

## 2023-01-18 DIAGNOSIS — F4323 Adjustment disorder with mixed anxiety and depressed mood: Secondary | ICD-10-CM | POA: Diagnosis not present

## 2023-02-08 DIAGNOSIS — F4323 Adjustment disorder with mixed anxiety and depressed mood: Secondary | ICD-10-CM | POA: Diagnosis not present

## 2023-03-01 DIAGNOSIS — F4323 Adjustment disorder with mixed anxiety and depressed mood: Secondary | ICD-10-CM | POA: Diagnosis not present

## 2023-03-31 ENCOUNTER — Encounter: Payer: Self-pay | Admitting: Family Medicine

## 2023-03-31 ENCOUNTER — Ambulatory Visit: Payer: BC Managed Care – PPO | Admitting: Family Medicine

## 2023-03-31 VITALS — BP 122/70 | HR 58 | Temp 98.3°F | Ht 74.0 in | Wt 237.6 lb

## 2023-03-31 DIAGNOSIS — L720 Epidermal cyst: Secondary | ICD-10-CM | POA: Diagnosis not present

## 2023-03-31 NOTE — Progress Notes (Unsigned)
Subjective:  Patient ID: Paul Rich, male    DOB: Jul 08, 1960  Age: 62 y.o. MRN: [id number]  CC:  Chief Complaint  Patient presents with   Mass    Pt notes a lump on his Rt side mid torso, notes tried draining it but had no luck, noticed it about a month ago, no tender to the touch, no redness, no heat or discharge     HPI Paul Rich presents for   R torso lesion: Noted small bump 6 weeks ago. Increased in size, thought may be a cyst. Increased size. Tried to lance a few weeks ago with needle - small amt of blood, minimal change in size.  Only sore to press on area. No d/c.  No surrounding redness.  Tx: topical polysporin, hydrocortisone - no changes.     History Patient Active Problem List   Diagnosis Date Noted   COVID-19 05/14/2021   SBO (small bowel obstruction) (HCC) 08/04/2019   Testosterone deficiency in male 08/04/2019   Allergic rhinitis due to other allergen 05/24/2011   Allergy to shellfish 05/24/2011   OSA (obstructive sleep apnea) 05/21/2011   Hemorrhoids 10/12/2008   COLONIC POLYPS 08/29/2008   Dyslipidemia 08/29/2008   RECTAL BLEEDING 08/29/2008   Past Medical History:  Diagnosis Date   Allergy    pollen   AV block    Childhood asthma    Environmental allergies    Hemorrhage of rectum and anus    Other and unspecified hyperlipidemia    SBO (small bowel obstruction) (HCC) 08/2019   Sleep apnea    stopped using cpap years ago   Testosterone deficiency in male 08/04/2019   Past Surgical History:  Procedure Laterality Date   COLONOSCOPY     HERNIA REPAIR  06/03/2010   inguinal and umbilical   INGUINAL HERNIA REPAIR     in high school   POLYPECTOMY     WISDOM TOOTH EXTRACTION     age 75   Allergies  Allergen Reactions   Shellfish Allergy Anaphylaxis and Swelling    Of swelling of eyes Of swelling of eyes Of swelling of eyes   Other Hives   Mold Extract [Trichophyton]    Prior to Admission medications   Medication Sig Start  Date End Date Taking? Authorizing Provider  aspirin 81 MG tablet Take 81 mg by mouth daily.   Yes [provider]  miconazole (MICOTIN) 2 % cream Apply 1 Application topically 2 (two) times daily. 03/08/22  Yes Shade Flood, MD  Multiple Vitamin (MULTIVITAMIN) tablet Take 1 tablet by mouth daily.   Yes [provider]  niacinamide 500 MG tablet Take 500 mg by mouth 2 (two) times daily with a meal.   Yes [provider]  Omega 3 1000 MG CAPS Take by mouth.   Yes [provider]  omega-3 acid ethyl esters (LOVAZA) 1 g capsule TAKE 2 CAPSULES TWICE A DAY 11/20/22  Yes Shade Flood, MD  OVER THE COUNTER MEDICATION Take 1 capsule by mouth daily. Nitric Oxide   Yes [provider]  OVER THE COUNTER MEDICATION Take 1 capsule by mouth daily. Vitamin A & D Combination   Yes [provider]  pravastatin (PRAVACHOL) 20 MG tablet TAKE 1 TABLET DAILY 05/16/22  Yes Shade Flood, MD  Pregnenolone Micronized (PREGNENOLONE PO) Take 1 capsule by mouth daily.   Yes [provider]  PRESCRIPTION MEDICATION HCG injectable taking 2 ml  Two times weekly   Yes [provider]  RESVERATROL PO Take 1 capsule by mouth daily.   Yes [provider]  tadalafil (CIALIS) 5 MG tablet Take 1 tablet (5 mg total) by mouth daily as needed for erectile dysfunction. 06/11/21  Yes Shade Flood, MD  testosterone cypionate (DEPOTESTOSTERONE CYPIONATE) 200 MG/ML injection Inject 90 mg into the muscle every 7 (seven) days. 0.45 mL Twice a week   Yes [provider]  valACYclovir (VALTREX) 500 MG tablet Take 1 tablet (500 mg total) by mouth 2 (two) times daily. For 3 days, start at first sign of rash/flare. 11/08/22  Yes Shade Flood, MD   Social History   Socioeconomic History   Marital status: Married    Spouse name: Not on file   Number of children: Not on file   Years of education: Not on file   Highest education level:  Doctorate  Occupational History   Occupation: HR exec-business  Tobacco Use   Smoking status: Never   Smokeless tobacco: Never  Vaping Use   Vaping status: Never Used  Substance and Sexual Activity   Alcohol use: Yes    Alcohol/week: 3.0 - 4.0 standard drinks of alcohol    Types: 3 - 4 Shots of liquor per week    Comment: occ red wine and beer but usually burbon   Drug use: No   Sexual activity: Yes  Other Topics Concern   Not on file  Social History Narrative   Divorced   Exercise: Tes, 4 times a week   Education: PHD   Social Determinants of Health   Financial Resource Strain: Low Risk  (03/31/2023)   Overall Financial Resource Strain (CARDIA)    Difficulty of Paying Living Expenses: Not hard at all  Food Insecurity: No Food Insecurity (03/31/2023)   Hunger Vital Sign    Worried About Running Out of Food in the Last Year: Never true    Ran Out of Food in the Last Year: Never true  Transportation Needs: No Transportation Needs (03/31/2023)   PRAPARE - Administrator, Civil Service (Medical): No    Lack of Transportation (Non-Medical): No  Physical Activity: Sufficiently Active (03/31/2023)   Exercise Vital Sign    Days of Exercise per Week: 5 days    Minutes of Exercise per Session: 60 min  Stress: No Stress Concern Present (03/31/2023)   [employer] of Occupational Health - Occupational Stress Questionnaire    Feeling of Stress : Only a little  Social Connections: Socially Integrated (03/31/2023)   Social Connection and Isolation Panel [NHANES]    Frequency of Communication with Friends and Family: Twice a week    Frequency of Social Gatherings with Friends and Family: Once a week    Attends Religious Services: 1 to 4 times per year    Active Member of [employer] or Organizations: No    Attends Engineer, structural: More than 4 times per year    Marital Status: Married  Catering manager Violence: Not on file    Review of  Systems   Objective:   Vitals:   03/31/23 1358  BP: 122/70  Pulse: (!) 58  Temp: 98.3 F (36.8 C)  TempSrc: Temporal  SpO2: 96%  Weight: 237 lb 9.6 oz (107.8 kg)  Height: 6\' 2"  (1.88 m)     Physical Exam Vitals reviewed.  Constitutional:      General: He is not in acute distress.    Appearance: Normal appearance. He is well-developed.  HENT:     Head:  Normocephalic and atraumatic.  Cardiovascular:     Rate and Rhythm: Normal rate.  Pulmonary:     Effort: Pulmonary effort is normal.  Skin:    Comments: R chest wall, 8mm x 10mm cystic lesion.  Small central opening next to hair.  No discharge, no central fluctuance.  No surrounding erythema, minimal discomfort with pressure.  No vascular streaks.  Neurological:     Mental Status: He is alert and oriented to person, place, and time.  Psychiatric:        Mood and Affect: Mood normal.         Assessment & Plan:  Paul Rich is a 62 y.o. male . Epidermoid cyst of skin of chest Suspected epidermoid or sebaceous cyst, less likely lipoma.  Does not appear infected.  Refer to dermatology for possible excision.  RTC precautions if signs or symptoms of infection.  No orders of the defined types were placed in this encounter.  Patient Instructions  Area on the side of her chest wall appears to be a cyst, and likely a benign skin cyst but those can be removed if needed.  I will refer you to dermatology to evaluate that area and also discussed removal of that lesion.  If any signs of infection including spreading redness, active discharge, or increasing size or pain be seen but I do not expect that to occur.    Signed,   Meredith Staggers, MD [facility], [facility] 03/31/23 3:00 PM

## 2023-03-31 NOTE — Patient Instructions (Signed)
Area on the side of her chest wall appears to be a cyst, and likely a benign skin cyst but those can be removed if needed.  I will refer you to dermatology to evaluate that area and also discussed removal of that lesion.  If any signs of infection including spreading redness, active discharge, or increasing size or pain be seen but I do not expect that to occur.

## 2023-04-01 ENCOUNTER — Encounter: Payer: Self-pay | Admitting: Family Medicine

## 2023-04-11 DIAGNOSIS — L738 Other specified follicular disorders: Secondary | ICD-10-CM | POA: Diagnosis not present

## 2023-04-11 DIAGNOSIS — L729 Follicular cyst of the skin and subcutaneous tissue, unspecified: Secondary | ICD-10-CM | POA: Diagnosis not present

## 2023-04-11 DIAGNOSIS — L821 Other seborrheic keratosis: Secondary | ICD-10-CM | POA: Diagnosis not present

## 2023-04-14 ENCOUNTER — Encounter: Payer: Self-pay | Admitting: Family Medicine

## 2023-04-19 DIAGNOSIS — F4323 Adjustment disorder with mixed anxiety and depressed mood: Secondary | ICD-10-CM | POA: Diagnosis not present

## 2023-04-23 ENCOUNTER — Other Ambulatory Visit: Payer: Self-pay | Admitting: Family Medicine

## 2023-04-23 DIAGNOSIS — E785 Hyperlipidemia, unspecified: Secondary | ICD-10-CM

## 2023-05-24 DIAGNOSIS — F4323 Adjustment disorder with mixed anxiety and depressed mood: Secondary | ICD-10-CM | POA: Diagnosis not present

## 2023-06-01 DIAGNOSIS — R051 Acute cough: Secondary | ICD-10-CM | POA: Diagnosis not present

## 2023-06-01 DIAGNOSIS — J0140 Acute pansinusitis, unspecified: Secondary | ICD-10-CM | POA: Diagnosis not present

## 2023-06-01 DIAGNOSIS — Z6828 Body mass index (BMI) 28.0-28.9, adult: Secondary | ICD-10-CM | POA: Diagnosis not present

## 2023-06-13 DIAGNOSIS — L570 Actinic keratosis: Secondary | ICD-10-CM | POA: Diagnosis not present

## 2023-06-13 DIAGNOSIS — D485 Neoplasm of uncertain behavior of skin: Secondary | ICD-10-CM | POA: Diagnosis not present

## 2023-06-13 DIAGNOSIS — D225 Melanocytic nevi of trunk: Secondary | ICD-10-CM | POA: Diagnosis not present

## 2023-06-21 DIAGNOSIS — F4323 Adjustment disorder with mixed anxiety and depressed mood: Secondary | ICD-10-CM | POA: Diagnosis not present

## 2023-07-02 ENCOUNTER — Ambulatory Visit: Payer: BC Managed Care – PPO | Admitting: Family Medicine

## 2023-07-02 VITALS — BP 120/74 | HR 74 | Temp 98.0°F | Ht 74.0 in | Wt 235.2 lb

## 2023-07-02 DIAGNOSIS — Z23 Encounter for immunization: Secondary | ICD-10-CM | POA: Diagnosis not present

## 2023-07-02 DIAGNOSIS — N529 Male erectile dysfunction, unspecified: Secondary | ICD-10-CM | POA: Diagnosis not present

## 2023-07-02 DIAGNOSIS — Z0000 Encounter for general adult medical examination without abnormal findings: Secondary | ICD-10-CM | POA: Diagnosis not present

## 2023-07-02 MED ORDER — TADALAFIL 5 MG PO TABS: 5.0000 mg | ORAL_TABLET | Freq: Every day | ORAL | 1 refills | Status: AC | PRN

## 2023-07-02 NOTE — Progress Notes (Signed)
Subjective:  Patient ID: Paul Rich, male    DOB: [birth year]  Age: 63 y.o. MRN: [id number]  CC:  Chief Complaint  Patient presents with   Annual Exam    Pt is doing okay notes minimal sleep 5-6 hours notes he tries to sleep longer but can't usually even with melatonin, had a CPAP a long time about but wife notes he no longer snores     HPI Paul Rich presents for Annual Exam and concerns as above.  PCP, [facility], referred in October for epidermoid cyst of skin of chest. Dermatology specialists, Jaquita Rector, PAC.  Followed by [facility], labs performed at that facility.  Treated with testosterone 40mg  2 times per week. Ongoing labs -reviewed scanned document from 04/03/2023 labs: Hemoglobin 15.9.  Platelets 189.  Potassium elevated at 5.2, phosphorus 5.5, otherwise CMP normal.  LFTs normal with ALT 33, AST 28.  GFR estimated at 65. Hemoglobin A1c elevated at 5.8, lower than January 24, 2023 at 6.3 at that time.  Normal iron ferritin B12 and folate. Lipids, triglycerides 285, total cholesterol 153, HDL 30, LDL 76.  Minimal change from 01/24/2023.  LDL 79 at that time.  Normal homocystine and apolipoprotein B levels. Vitamin D level normal at 41.  TSH normal at 1.543.  PSA normal at 0.69. Testosterone was elevated at 1360, previous levels ranging from 600s to 900s.  Has been attributed previously to having levels drawn to close to most recent injection.  Planned labs in next month.   Sleeping 6 hours, even with use of melatonin.  History of OSA on CPAP previously but reports no longer snoring. Feels rested, good workouts, no daytime somnolence. No nighttime wakening.   History of positive HSV antibody Genital rash discussed at his May 2024 visit.  Described as sore/ulcerative area at that time with few bumps.  Previous treatment for balanitis in October 2023.  With positive HSV antibody, Valtrex provided if any recurrence of genital rash. No return of rash or recent need  for valtrex.   Erectile dysfunction He is on testosterone as above, has been treated with Cialis 5 mg as needed previously. Taking 5mg  daily most days, no HA/flushing or side effects. No vision or hearing changes.  No Cp/doe with exertion/intercourse.   Hyperlipidemia: Pravastatin 42m every day and lovaza 1 g BID. No new myalgias/side effects Lab Results  Component Value Date   CHOL 181 07/13/2021   HDL 34.00 (L) 07/13/2021   LDLCALC 92 02/29/2016   LDLDIRECT 96.0 07/13/2021   TRIG 326.0 (H) 07/13/2021   CHOLHDL 5 07/13/2021   Lab Results  Component Value Date   ALT 40 06/11/2021   AST 28 06/11/2021   ALKPHOS 48 06/11/2021   BILITOT 0.8 06/11/2021         07/02/2023    3:41 PM 03/31/2023    2:02 PM 10/31/2022   11:06 AM 03/08/2022   10:55 AM 06/11/2021    8:47 AM  Depression screen PHQ 2/9  Decreased Interest 0 0 0 0 1  Down, Depressed, Hopeless 0 0 0 0 0  PHQ - 2 Score 0 0 0 0 1  Altered sleeping 0  0 0 1  Tired, decreased energy 0  0 0 2  Change in appetite 0  0 0 0  Feeling bad or failure about yourself  0  0 0 0  Trouble concentrating 0  0 0 0  Moving slowly or fidgety/restless 0  0 0 0  Suicidal thoughts 0  0  0 0  PHQ-9 Score 0  0 0 4    Health Maintenance  Topic Date Due   DTaP/Tdap/Td (2 - Td or Tdap) 03/23/2022   Colonoscopy  04/10/2026   INFLUENZA VACCINE  Completed   COVID-19 Vaccine  Completed   Hepatitis C Screening  Completed   HIV Screening  Completed   Zoster Vaccines- Shingrix  Completed   HPV VACCINES  Aged Out  Colonoscopy 04/10/21. Dr. Adela Lank, multiple polyps, adenomatous, repeat in 5 yrs.  Prostate: does not have family history of prostate cancer. Recent PSA with [facility] as above.   Immunization History  Administered Date(s) Administered   Influenza Inj Mdck Quad Pf 02/21/2022   Influenza Split 03/04/2011, 03/03/2012, 04/04/2015   Influenza,inj,Quad PF,6+ Mos 03/23/2021   Influenza-Unspecified 03/18/2019, 03/15/2020, 03/21/2023    PFIZER(Purple Top)SARS-COV-2 Vaccination 09/11/2019, 10/02/2019, 04/07/2020, 01/26/2021   Tdap 03/23/2012   Unspecified SARS-COV-2 Vaccination 03/21/2023   Zoster Recombinant(Shingrix) 11/30/2020, 03/23/2021  Prevnar today and tdap.   No results found. Optho - due - plans to schedule.   Dental:Within Last 6 months  Alcohol: 2 in past month.   Tobacco: none  Exercise: 5-6 days per week. Cardio and resistance - near or more than per week.    History Patient Active Problem List   Diagnosis Date Noted   COVID-19 05/14/2021   SBO (small bowel obstruction) (HCC) 08/04/2019   Testosterone deficiency in male 08/04/2019   Allergic rhinitis due to other allergen 05/24/2011   Allergy to shellfish 05/24/2011   OSA (obstructive sleep apnea) 05/21/2011   Hemorrhoids 10/12/2008   COLONIC POLYPS 08/29/2008   Dyslipidemia 08/29/2008   RECTAL BLEEDING 08/29/2008   Past Medical History:  Diagnosis Date   Allergy    pollen   AV block    Childhood asthma    Environmental allergies    Hemorrhage of rectum and anus    Other and unspecified hyperlipidemia    SBO (small bowel obstruction) (HCC) 08/2019   Sleep apnea    stopped using cpap years ago   Testosterone deficiency in male 08/04/2019   Past Surgical History:  Procedure Laterality Date   COLONOSCOPY     HERNIA REPAIR  06/03/2010   inguinal and umbilical   INGUINAL HERNIA REPAIR     in high school   POLYPECTOMY     WISDOM TOOTH EXTRACTION     age 7   Allergies  Allergen Reactions   Shellfish Allergy Anaphylaxis and Swelling    Of swelling of eyes Of swelling of eyes Of swelling of eyes   Other Hives   Mold Extract [Trichophyton]    Prior to Admission medications   Medication Sig Start Date End Date Taking? Authorizing Provider  aspirin 81 MG tablet Take 81 mg by mouth daily.   Yes [provider]  miconazole (MICOTIN) 2 % cream Apply 1 Application topically 2 (two) times daily. 03/08/22  Yes  Shade Flood, MD  Multiple Vitamin (MULTIVITAMIN) tablet Take 1 tablet by mouth daily.   Yes [provider]  niacinamide 500 MG tablet Take 500 mg by mouth 2 (two) times daily with a meal.   Yes [provider]  Omega 3 1000 MG CAPS Take by mouth.   Yes [provider]  omega-3 acid ethyl esters (LOVAZA) 1 g capsule TAKE 2 CAPSULES TWICE A DAY 11/20/22  Yes Shade Flood, MD  OVER THE COUNTER MEDICATION Take 1 capsule by mouth daily. Nitric Oxide   Yes [provider]  OVER THE COUNTER MEDICATION Take 1 capsule by mouth daily. Vitamin A & D Combination   Yes [provider]  pravastatin (PRAVACHOL) 20 MG tablet TAKE 1 TABLET DAILY 04/23/23  Yes Shade Flood, MD  Pregnenolone Micronized (PREGNENOLONE PO) Take 1 capsule by mouth daily.   Yes [provider]  PRESCRIPTION MEDICATION HCG injectable taking 2 ml  Two times weekly   Yes [provider]  RESVERATROL PO Take 1 capsule by mouth daily.   Yes [provider]  tadalafil (CIALIS) 5 MG tablet Take 1 tablet (5 mg total) by mouth daily as needed for erectile dysfunction. 06/11/21  Yes Shade Flood, MD  testosterone cypionate (DEPOTESTOSTERONE CYPIONATE) 200 MG/ML injection Inject 90 mg into the muscle every 7 (seven) days. 0.45 mL Twice a week   Yes [provider]  valACYclovir (VALTREX) 500 MG tablet Take 1 tablet (500 mg total) by mouth 2 (two) times daily. For 3 days, start at first sign of rash/flare. 11/08/22  Yes Shade Flood, MD   Social History   Socioeconomic History   Marital status: Married    Spouse name: Not on file   Number of children: Not on file   Years of education: Not on file   Highest education level: Doctorate  Occupational History   Occupation: HR exec-business  Tobacco Use   Smoking status: Never   Smokeless tobacco: Never  Vaping Use   Vaping status: Never Used  Substance and Sexual Activity   Alcohol use: Yes     Alcohol/week: 3.0 - 4.0 standard drinks of alcohol    Types: 3 - 4 Shots of liquor per week    Comment: occ red wine and beer but usually burbon   Drug use: No   Sexual activity: Yes  Other Topics Concern   Not on file  Social History Narrative   Divorced   Exercise: Tes, 4 times a week   Education: PHD   Social Drivers of Corporate investment banker Strain: Low Risk  (03/31/2023)   Overall Financial Resource Strain (CARDIA)    Difficulty of Paying Living Expenses: Not hard at all  Food Insecurity: No Food Insecurity (03/31/2023)   Hunger Vital Sign    Worried About Running Out of Food in the Last Year: Never true    Ran Out of Food in the Last Year: Never true  Transportation Needs: No Transportation Needs (03/31/2023)   PRAPARE - Administrator, Civil Service (Medical): No    Lack of Transportation (Non-Medical): No  Physical Activity: Sufficiently Active (03/31/2023)   Exercise Vital Sign    Days of Exercise per Week: 5 days    Minutes of Exercise per Session: 60 min  Stress: No Stress Concern Present (03/31/2023)   [employer] of Occupational Health - Occupational Stress Questionnaire    Feeling of Stress : Only a little  Social Connections: Socially Integrated (03/31/2023)   Social Connection and Isolation Panel [NHANES]    Frequency of Communication with Friends and Family: Twice a week    Frequency of Social Gatherings with Friends and Family: Once a week    Attends Religious Services: 1 to 4 times per year    Active Member of [employer] or Organizations: No    Attends Engineer, structural: More than 4 times per year    Marital Status: Married  Catering manager Violence: Not on file    Review of Systems 13 point review of systems per patient health  survey noted.  Negative other than as indicated above or in HPI.    Objective:   Vitals:   07/02/23 1543  BP: 120/74  Pulse: 74  Temp: 98 F (36.7 C)  TempSrc: Temporal  SpO2: 95%   Weight: 235 lb 3.2 oz (106.7 kg)  Height: 6\' 2"  (1.88 m)     Physical Exam Vitals reviewed.  Constitutional:      Appearance: He is well-developed.  HENT:     Head: Normocephalic and atraumatic.     Right Ear: External ear normal.     Left Ear: External ear normal.  Eyes:     Conjunctiva/sclera: Conjunctivae normal.     Pupils: Pupils are equal, round, and reactive to light.  Neck:     Thyroid: No thyromegaly.  Cardiovascular:     Rate and Rhythm: Normal rate and regular rhythm.     Heart sounds: Normal heart sounds.  Pulmonary:     Effort: Pulmonary effort is normal. No respiratory distress.     Breath sounds: Normal breath sounds. No wheezing.  Abdominal:     General: There is no distension.     Palpations: Abdomen is soft.     Tenderness: There is no abdominal tenderness.  Musculoskeletal:        General: No tenderness. Normal range of motion.     Cervical back: Normal range of motion and neck supple.  Lymphadenopathy:     Cervical: No cervical adenopathy.  Skin:    General: Skin is warm and dry.  Neurological:     Mental Status: He is alert and oriented to person, place, and time.     Deep Tendon Reflexes: Reflexes are normal and symmetric.  Psychiatric:        Behavior: Behavior normal.        Assessment & Plan:  Paul Rich is a 63 y.o. male . Annual physical exam  - -anticipatory guidance as below in AVS, screening labs above. Health maintenance items as above in HPI discussed/recommended as applicable.   Need for tetanus booster - Plan: Tdap vaccine greater than or equal to 7yo IM  Erectile dysfunction, unspecified erectile dysfunction type - Plan: tadalafil (CIALIS) 5 MG tablet  -Cialis refilled, potential side effects and risk discussed.  -Testosterone managed by other provider, with adjustment of doses and close monitoring of levels.  Previous elevated reading may have been due to timing of lab and recent dosing.  Continue follow-up with other  provider and ongoing laboratory monitoring with use of testosterone.  Need for pneumococcal vaccination - Plan: Pneumococcal conjugate vaccine 20-valent (Prevnar 20)  Chronic conditions stable, continue same med regimen.  We did discuss the decreased sleep of 5 to 6 hours but he is well rested and potentially may just have a decreased need for total hours of sleep.  RTC precautions if symptomatic, or option to meet with sleep specialist, will hold for now.  Meds ordered this encounter  Medications   tadalafil (CIALIS) 5 MG tablet    Sig: Take 1 tablet (5 mg total) by mouth daily as needed for erectile dysfunction.    Dispense:  90 tablet    Refill:  1   There are no Patient Instructions on file for this visit.    Signed,   Meredith Staggers, MD [facility], [facility] 07/02/23 3:57 PM

## 2023-07-03 ENCOUNTER — Other Ambulatory Visit: Payer: Self-pay | Admitting: Family Medicine

## 2023-07-03 DIAGNOSIS — N529 Male erectile dysfunction, unspecified: Secondary | ICD-10-CM

## 2023-07-04 ENCOUNTER — Encounter: Payer: Self-pay | Admitting: Family Medicine

## 2023-07-12 DIAGNOSIS — F4323 Adjustment disorder with mixed anxiety and depressed mood: Secondary | ICD-10-CM | POA: Diagnosis not present

## 2023-07-26 DIAGNOSIS — F4323 Adjustment disorder with mixed anxiety and depressed mood: Secondary | ICD-10-CM | POA: Diagnosis not present

## 2023-08-23 DIAGNOSIS — F4323 Adjustment disorder with mixed anxiety and depressed mood: Secondary | ICD-10-CM | POA: Diagnosis not present

## 2023-10-11 DIAGNOSIS — F4323 Adjustment disorder with mixed anxiety and depressed mood: Secondary | ICD-10-CM | POA: Diagnosis not present

## 2023-11-08 DIAGNOSIS — F4323 Adjustment disorder with mixed anxiety and depressed mood: Secondary | ICD-10-CM | POA: Diagnosis not present

## 2023-11-29 DIAGNOSIS — F4323 Adjustment disorder with mixed anxiety and depressed mood: Secondary | ICD-10-CM | POA: Diagnosis not present

## 2023-12-27 DIAGNOSIS — F4323 Adjustment disorder with mixed anxiety and depressed mood: Secondary | ICD-10-CM | POA: Diagnosis not present

## 2024-01-01 LAB — LAB REPORT - SCANNED
A1c: 5.8
EGFR: 68
Free T4: 1.25 ng/dL
PSA, Total: 0.935
TSH: 2

## 2024-01-31 DIAGNOSIS — F4323 Adjustment disorder with mixed anxiety and depressed mood: Secondary | ICD-10-CM | POA: Diagnosis not present

## 2024-02-05 ENCOUNTER — Ambulatory Visit: Admitting: Family Medicine

## 2024-02-05 ENCOUNTER — Encounter: Payer: Self-pay | Admitting: Family Medicine

## 2024-02-05 VITALS — BP 122/70 | HR 79 | Temp 99.3°F | Ht 74.0 in | Wt 233.4 lb

## 2024-02-05 DIAGNOSIS — U071 COVID-19: Secondary | ICD-10-CM

## 2024-02-05 DIAGNOSIS — D229 Melanocytic nevi, unspecified: Secondary | ICD-10-CM | POA: Diagnosis not present

## 2024-02-05 MED ORDER — GUAIFENESIN-CODEINE 100-10 MG/5ML PO SOLN: 5.0000 mL | Freq: Every evening | ORAL | 0 refills | Status: DC | PRN

## 2024-02-05 MED ORDER — BENZONATATE 100 MG PO CAPS
100.0000 mg | ORAL_CAPSULE | Freq: Three times a day (TID) | ORAL | 0 refills | Status: DC | PRN
Start: 2024-02-05 — End: 2024-03-06

## 2024-02-05 NOTE — Patient Instructions (Signed)
 Sorry to hear that you are sick.  Based on current symptoms I think it would be reasonable to continue home treatment with Mucinex  as needed for cough or congestion, Tylenol  or Motrin as needed for fever and bodyaches, drink plenty of fluids and rest.  Tessalon  Perles were sent in for cough during the day if needed as well as codeine  cough syrup if needed at night.  If you change her mind regarding the antiviral, let me know by tomorrow as that medicine does need to be used within the first 5 days.  As we discussed there are some possible side effects, risks with that antiviral as well.  Be seen if any worsening symptoms.  I will refer you to dermatology for the lower abdominal wall skin lesions.  Try to avoid any further treatments prior to that visit so they are able to see those lesions in their natural state.  If any spread of lesions or redness, discharge around those areas, I am happy to see you sooner.    Take care.

## 2024-02-05 NOTE — Progress Notes (Signed)
 Subjective:  Patient ID: Paul Rich, male    DOB: [birth year]  Age: 63 y.o. MRN: [id number]  CC:  Chief Complaint  Patient presents with   Skin Problem    Pt notes 2 moles new over last 1-2 months, wanted this checked    Covid Positive    Since scheduling appt today, Pt had Shingles #1, Hepatitis A/B and Flu shot Sunday, Monday started with fever, cough, congestion, tested positive Thursday morning 9/4    HPI Avrom Robarts presents for  Concerns above  COVID-19 infection,  Multiple vaccines 3 days ago with Shingrix, hepatitis A/B, and flu vaccine.  Next day had fever cough congestion - 2 days ago, HA, bodyaches.  Positive COVID test at home this morning. Out of work this morning.  Fever yesterday - subjective.   Current symptoms: body aches, night sweat last night. Slight congestion, dyspnea with increased activity only. No confusion, chest confusion, and drinking fluids.   Attempted treatments: otc nyquil, ibuprofen, tylenol . Lozenges, ibuprofen.    Discussed antiviral  - declined at this time - option to call for med in next 24 hours.   New skin lesions Reports 2 new moles noticed over the past few months - past 4-6 weeks.  Not itching, redness.  No penile lesions, scrotal lesions. ` He has seen dermatology [facility] previously, epidermoid cyst of skin of chest last October. No recent visit.  Tx: topical cautery device last weekend.       History Patient Active Problem List   Diagnosis Date Noted   COVID-19 05/14/2021   SBO (small bowel obstruction) (HCC) 08/04/2019   Testosterone  deficiency in male 08/04/2019   Allergic rhinitis due to other allergen 05/24/2011   Allergy to shellfish 05/24/2011   OSA (obstructive sleep apnea) 05/21/2011   Hemorrhoids 10/12/2008   COLONIC POLYPS 08/29/2008   Dyslipidemia 08/29/2008   RECTAL BLEEDING 08/29/2008   Past Medical History:  Diagnosis Date   Allergy    pollen   AV block    Childhood asthma     Environmental allergies    Hemorrhage of rectum and anus    Other and unspecified hyperlipidemia    SBO (small bowel obstruction) (HCC) 08/2019   Sleep apnea    stopped using cpap years ago   Testosterone  deficiency in male 08/04/2019   Past Surgical History:  Procedure Laterality Date   COLONOSCOPY     HERNIA REPAIR  06/03/2010   inguinal and umbilical   INGUINAL HERNIA REPAIR     in high school   POLYPECTOMY     WISDOM TOOTH EXTRACTION     age 51   Allergies  Allergen Reactions   Shellfish Allergy Anaphylaxis and Swelling    Of swelling of eyes Of swelling of eyes Of swelling of eyes   Other Hives   Mold Extract [Trichophyton]    Prior to Admission medications   Medication Sig Start Date End Date Taking? Authorizing Provider  aspirin 81 MG tablet Take 81 mg by mouth daily.   Yes [provider]  miconazole  (MICOTIN) 2 % cream Apply 1 Application topically 2 (two) times daily. 03/08/22  Yes Levora Reyes SAUNDERS, MD  Multiple Vitamin (MULTIVITAMIN) tablet Take 1 tablet by mouth daily.   Yes [provider]  niacinamide 500 MG tablet Take 500 mg by mouth 2 (two) times daily with a meal.   Yes [provider]  Omega 3 1000 MG CAPS Take by mouth.   Yes [provider]  omega-3  acid ethyl esters (LOVAZA ) 1 g capsule TAKE 2 CAPSULES TWICE A DAY 11/20/22  Yes Levora Reyes SAUNDERS, MD  OVER THE COUNTER MEDICATION Take 1 capsule by mouth daily. Nitric Oxide   Yes [provider]  OVER THE COUNTER MEDICATION Take 1 capsule by mouth daily. Vitamin A & D Combination   Yes [provider]  pravastatin  (PRAVACHOL ) 20 MG tablet TAKE 1 TABLET DAILY 04/23/23  Yes Levora Reyes SAUNDERS, MD  Pregnenolone Micronized (PREGNENOLONE PO) Take 1 capsule by mouth daily.   Yes [provider]  PRESCRIPTION MEDICATION HCG injectable taking 2 ml  Two times weekly   Yes [provider]  RESVERATROL PO Take 1 capsule by mouth daily.   Yes  [provider]  tadalafil  (CIALIS ) 5 MG tablet Take 1 tablet (5 mg total) by mouth daily as needed for erectile dysfunction. 07/02/23  Yes Levora Reyes SAUNDERS, MD  testosterone  cypionate (DEPOTESTOSTERONE CYPIONATE) 200 MG/ML injection Inject 90 mg into the muscle every 7 (seven) days. 0.45 mL Twice a week   Yes [provider]  valACYclovir  (VALTREX ) 500 MG tablet Take 1 tablet (500 mg total) by mouth 2 (two) times daily. For 3 days, start at first sign of rash/flare. 11/08/22  Yes Levora Reyes SAUNDERS, MD   Social History   Socioeconomic History   Marital status: Married    Spouse name: Not on file   Number of children: Not on file   Years of education: Not on file   Highest education level: Doctorate  Occupational History   Occupation: HR exec-business  Tobacco Use   Smoking status: Never   Smokeless tobacco: Never  Vaping Use   Vaping status: Never Used  Substance and Sexual Activity   Alcohol use: Yes    Alcohol/week: 3.0 - 4.0 standard drinks of alcohol    Types: 3 - 4 Shots of liquor per week    Comment: occ red wine and beer but usually burbon   Drug use: No   Sexual activity: Yes  Other Topics Concern   Not on file  Social History Narrative   Divorced   Exercise: Tes, 4 times a week   Education: PHD   Social Drivers of Corporate investment banker Strain: Low Risk  (03/31/2023)   Overall Financial Resource Strain (CARDIA)    Difficulty of Paying Living Expenses: Not hard at all  Food Insecurity: No Food Insecurity (03/31/2023)   Hunger Vital Sign    Worried About Running Out of Food in the Last Year: Never true    Ran Out of Food in the Last Year: Never true  Transportation Needs: No Transportation Needs (03/31/2023)   PRAPARE - Administrator, Civil Service (Medical): No    Lack of Transportation (Non-Medical): No  Physical Activity: Sufficiently Active (03/31/2023)   Exercise Vital Sign    Days of Exercise per Week: 5 days    Minutes  of Exercise per Session: 60 min  Stress: No Stress Concern Present (03/31/2023)   [employer] of Occupational Health - Occupational Stress Questionnaire    Feeling of Stress : Only a little  Social Connections: Moderately Integrated (03/31/2023)   Social Connection and Isolation Panel    Frequency of Communication with Friends and Family: Twice a week    Frequency of Social Gatherings with Friends and Family: Once a week    Attends Religious Services: 1 to 4 times per year    Active Member of Clubs or Organizations: No  Attends Banker Meetings: Not on file    Marital Status: Married  Intimate Partner Violence: Not on file    Review of Systems Per HPI.   Objective:   Vitals:   02/05/24 0947  BP: 122/70  Pulse: 79  Temp: 99.3 F (37.4 C)  TempSrc: Temporal  SpO2: 96%  Weight: 233 lb 6.4 oz (105.9 kg)  Height: 6' 2 (1.88 m)     Physical Exam Vitals reviewed.  Constitutional:      Appearance: He is well-developed.  HENT:     Head: Normocephalic and atraumatic.     Right Ear: Tympanic membrane, ear canal and external ear normal.     Left Ear: Tympanic membrane, ear canal and external ear normal.     Nose: No rhinorrhea.     Mouth/Throat:     Pharynx: No oropharyngeal exudate or posterior oropharyngeal erythema.  Eyes:     Conjunctiva/sclera: Conjunctivae normal.     Pupils: Pupils are equal, round, and reactive to light.  Cardiovascular:     Rate and Rhythm: Normal rate and regular rhythm.     Heart sounds: Normal heart sounds. No murmur heard. Pulmonary:     Effort: Pulmonary effort is normal.     Breath sounds: Normal breath sounds. No wheezing, rhonchi or rales.  Abdominal:     Palpations: Abdomen is soft.     Tenderness: There is no abdominal tenderness.  Musculoskeletal:     Cervical back: Neck supple.  Lymphadenopathy:     Cervical: No cervical adenopathy.  Skin:    General: Skin is warm and dry.     Findings: No rash.      Comments: 2 lesions lower abdominal wall, see photos.  Top of lesions darkened eschar, underlying verrucal appearing lesions, no surrounding erythema.  Neurological:     Mental Status: He is alert and oriented to person, place, and time.  Psychiatric:        Behavior: Behavior normal.         Assessment & Plan:  Jeffie Spivack is a 63 y.o. male . Nevus, atypical - Plan: Ambulatory referral to Dermatology  - Question verruca lesions versus seborrheic keratoses with darkening after home treatment with what sounds like home cautery device.  No sign of infection.  Denies any genital lesions.  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  Will refer to dermatology, advised to not treat with cautery device or other topical treatments at this time so Derm can see them in their natural state.  If any increased size, or new lesions to be seen sooner.  COVID-19 virus infection - Plan: benzonatate  (TESSALON ) 100 MG capsule, guaiFENesin -codeine  100-10 MG/5ML syrup  - Overall mild symptoms at this time.  Home treatment discussed, Tessalon  Perles, codeine  cough syrup if needed for cough.  Mucinex , Tylenol , fluids and rest discussed.  Contact precautions discussed per CDC with viral illness.  RTC/ER precautions given.  Meds ordered this encounter  Medications   benzonatate  (TESSALON ) 100 MG capsule    Sig: Take 1 capsule (100 mg total) by mouth 3 (three) times daily as needed for cough.    Dispense:  20 capsule    Refill:  0   guaiFENesin -codeine  100-10 MG/5ML syrup    Sig: Take 5-10 mLs by mouth at bedtime as needed for cough.    Dispense:  120 mL    Refill:  0   Patient Instructions  Sorry to hear that you are sick.  Based on current symptoms I think it would be  reasonable to continue home treatment with Mucinex  as needed for cough or congestion, Tylenol  or Motrin as needed for fever and bodyaches, drink plenty of fluids and rest.  Tessalon  Perles were sent in for cough during the day if needed as well as  codeine  cough syrup if needed at night.  If you change her mind regarding the antiviral, let me know by tomorrow as that medicine does need to be used within the first 5 days.  As we discussed there are some possible side effects, risks with that antiviral as well.  Be seen if any worsening symptoms.  I will refer you to dermatology for the lower abdominal wall skin lesions.  Try to avoid any further treatments prior to that visit so they are able to see those lesions in their natural state.  If any spread of lesions or redness, discharge around those areas, I am happy to see you sooner.    Take care.    Signed,   Reyes Pines, MD [facility], [facility] 02/05/24 10:34 AM

## 2024-02-10 DIAGNOSIS — A630 Anogenital (venereal) warts: Secondary | ICD-10-CM | POA: Diagnosis not present

## 2024-02-10 DIAGNOSIS — L820 Inflamed seborrheic keratosis: Secondary | ICD-10-CM | POA: Diagnosis not present

## 2024-03-04 ENCOUNTER — Observation Stay (HOSPITAL_COMMUNITY)
Admission: EM | Admit: 2024-03-04 | Discharge: 2024-03-06 | Disposition: A | Discharge status: Home / Self Care | Attending: Internal Medicine | Admitting: Internal Medicine

## 2024-03-04 DIAGNOSIS — Z7982 Long term (current) use of aspirin: Secondary | ICD-10-CM | POA: Insufficient documentation

## 2024-03-04 DIAGNOSIS — F1090 Alcohol use, unspecified, uncomplicated: Secondary | ICD-10-CM | POA: Diagnosis not present

## 2024-03-04 DIAGNOSIS — E785 Hyperlipidemia, unspecified: Secondary | ICD-10-CM | POA: Diagnosis present

## 2024-03-04 DIAGNOSIS — R9431 Abnormal electrocardiogram [ECG] [EKG]: Secondary | ICD-10-CM | POA: Diagnosis not present

## 2024-03-04 DIAGNOSIS — R110 Nausea: Secondary | ICD-10-CM | POA: Diagnosis present

## 2024-03-04 DIAGNOSIS — R109 Unspecified abdominal pain: Secondary | ICD-10-CM | POA: Diagnosis present

## 2024-03-04 DIAGNOSIS — Z79899 Other long term (current) drug therapy: Secondary | ICD-10-CM | POA: Diagnosis not present

## 2024-03-04 DIAGNOSIS — K5730 Diverticulosis of large intestine without perforation or abscess without bleeding: Secondary | ICD-10-CM | POA: Diagnosis not present

## 2024-03-04 DIAGNOSIS — K56609 Unspecified intestinal obstruction, unspecified as to partial versus complete obstruction: Secondary | ICD-10-CM | POA: Diagnosis not present

## 2024-03-04 DIAGNOSIS — D72829 Elevated white blood cell count, unspecified: Secondary | ICD-10-CM | POA: Diagnosis present

## 2024-03-04 DIAGNOSIS — R140 Abdominal distension (gaseous): Secondary | ICD-10-CM | POA: Diagnosis not present

## 2024-03-04 LAB — CBC
HCT: 58.3 % — ABNORMAL HIGH (ref 39.0–52.0)
Hemoglobin: 19.1 g/dL — ABNORMAL HIGH (ref 13.0–17.0)
MCH: 27.9 pg (ref 26.0–34.0)
MCHC: 32.8 g/dL (ref 30.0–36.0)
MCV: 85.2 fL (ref 80.0–100.0)
Platelets: 164 K/uL (ref 150–400)
RBC: 6.84 MIL/uL — ABNORMAL HIGH (ref 4.22–5.81)
RDW: 15.9 % — ABNORMAL HIGH (ref 11.5–15.5)
WBC: 13.2 K/uL — ABNORMAL HIGH (ref 4.0–10.5)
nRBC: 0 % (ref 0.0–0.2)

## 2024-03-04 LAB — URINALYSIS, ROUTINE W REFLEX MICROSCOPIC
Bacteria, UA: NONE SEEN
Bilirubin Urine: NEGATIVE
Glucose, UA: NEGATIVE mg/dL
Hgb urine dipstick: NEGATIVE
Ketones, ur: NEGATIVE mg/dL
Leukocytes,Ua: NEGATIVE
Nitrite: NEGATIVE
Protein, ur: NEGATIVE mg/dL
Specific Gravity, Urine: 1.017 (ref 1.005–1.030)
pH: 6 (ref 5.0–8.0)

## 2024-03-04 LAB — LIPASE, BLOOD: Lipase: 43 U/L (ref 11–51)

## 2024-03-04 LAB — COMPREHENSIVE METABOLIC PANEL WITH GFR
ALT: 37 U/L (ref 0–44)
AST: 33 U/L (ref 15–41)
Albumin: 4 g/dL (ref 3.5–5.0)
Alkaline Phosphatase: 41 U/L (ref 38–126)
Anion gap: 11 (ref 5–15)
BUN: 21 mg/dL (ref 8–23)
CO2: 23 mmol/L (ref 22–32)
Calcium: 9.8 mg/dL (ref 8.9–10.3)
Chloride: 100 mmol/L (ref 98–111)
Creatinine, Ser: 1.32 mg/dL — ABNORMAL HIGH (ref 0.61–1.24)
GFR, Estimated: >60 mL/min (ref >60)
Glucose, Bld: 116 mg/dL — ABNORMAL HIGH (ref 70–99)
Potassium: 4.2 mmol/L (ref 3.5–5.1)
Sodium: 134 mmol/L — ABNORMAL LOW (ref 135–145)
Total Bilirubin: 1.2 mg/dL (ref 0.0–1.2)
Total Protein: 7.3 g/dL (ref 6.5–8.1)

## 2024-03-04 NOTE — ED Triage Notes (Addendum)
 Pt states approximately 5 hours ago he was working out and began having abdominal discomfort and feeling distended. Pt states pain is similar to when he had a SBO that required ICU stay approximately  4.5 years ago. Pt endorses nausea. States he had one BM to try and relieve pain and stated it was diarrhea and yellow/green which is abnormal.

## 2024-03-04 NOTE — ED Triage Notes (Signed)
 Patient states he is having abdominal discomfort for the past 5 hours and it feels similar to a bowel obstruction he had in the past. Patient has been experiencing constipation. Denies nausea vomiting diarrhea.

## 2024-03-04 NOTE — ED Provider Notes (Signed)
 [place] EMERGENCY DEPARTMENT AT [facility] Provider Note   CSN: [id number] Arrival date & time: 03/04/24  2036     Patient presents with: Abdominal Pain   Paul Rich is a 63 y.o. male with history of SBO in 2021, treated with bowel rest and NG placement.  He states that this evening Around 5 hours prior to his presentation to the ED he began to have abdominal discomfort with pain, cramping, and distention that he felt was quite similar in nature to symptoms he experienced when he last had a bowel obstruction.  He did was able to have a small bowel movement while waiting in the waiting room here in the emergency department at approximately 10:20 PM and feels that his pain is improved following this.  No nausea vomiting fevers or chills.  Was feeling his normal health prior to this evening. Single episode of diarrhea prior to presenting to the ED.  Urinating as normal per patient. Hx of inguinal hernia repair.   {Add pertinent medical, surgical, social history, OB history to HPI:32947} HPI     Prior to Admission medications   Medication Sig Start Date End Date Taking? Authorizing Provider  aspirin 81 MG tablet Take 81 mg by mouth daily.    [provider]  benzonatate  (TESSALON ) 100 MG capsule Take 1 capsule (100 mg total) by mouth 3 (three) times daily as needed for cough. 02/05/24   Levora Reyes SAUNDERS, MD  guaiFENesin -codeine  100-10 MG/5ML syrup Take 5-10 mLs by mouth at bedtime as needed for cough. 02/05/24   Levora Reyes SAUNDERS, MD  miconazole  (MICOTIN) 2 % cream Apply 1 Application topically 2 (two) times daily. 03/08/22   Levora Reyes SAUNDERS, MD  Multiple Vitamin (MULTIVITAMIN) tablet Take 1 tablet by mouth daily.    [provider]  niacinamide 500 MG tablet Take 500 mg by mouth 2 (two) times daily with a meal.    [provider]  Omega 3 1000 MG CAPS Take by mouth.    [provider]  omega-3 acid ethyl esters (LOVAZA ) 1 g capsule TAKE 2  CAPSULES TWICE A DAY 11/20/22   Levora Reyes SAUNDERS, MD  OVER THE COUNTER MEDICATION Take 1 capsule by mouth daily. Nitric Oxide    [provider]  OVER THE COUNTER MEDICATION Take 1 capsule by mouth daily. Vitamin A & D Combination    [provider]  pravastatin  (PRAVACHOL ) 20 MG tablet TAKE 1 TABLET DAILY 04/23/23   Levora Reyes SAUNDERS, MD  Pregnenolone Micronized (PREGNENOLONE PO) Take 1 capsule by mouth daily.    [provider]  PRESCRIPTION MEDICATION HCG injectable taking 2 ml  Two times weekly    [provider]  RESVERATROL PO Take 1 capsule by mouth daily.    [provider]  tadalafil  (CIALIS ) 5 MG tablet Take 1 tablet (5 mg total) by mouth daily as needed for erectile dysfunction. 07/02/23   Levora Reyes SAUNDERS, MD  testosterone  cypionate (DEPOTESTOSTERONE CYPIONATE) 200 MG/ML injection Inject 90 mg into the muscle every 7 (seven) days. 0.45 mL Twice a week    [provider]  valACYclovir  (VALTREX ) 500 MG tablet Take 1 tablet (500 mg total) by mouth 2 (two) times daily. For 3 days, start at first sign of rash/flare. 11/08/22   Levora Reyes SAUNDERS, MD    Allergies: Shellfish allergy, Other, and Mold extract [trichophyton]    Review of Systems  Constitutional: Negative.   HENT: Negative.    Respiratory: Negative.  Gastrointestinal:  Positive for abdominal distention, abdominal pain, constipation and diarrhea. Negative for nausea and vomiting.  Genitourinary: Negative.   Neurological: Negative.     Updated Vital Signs BP (!) 148/97 (BP Location: Right Arm)   Pulse 75   Temp 98.9 F (37.2 C)   Resp 18   SpO2 95%   Physical Exam Vitals and nursing note reviewed.  HENT:     Head: Normocephalic and atraumatic.     Mouth/Throat:     Mouth: Mucous membranes are moist.     Pharynx: No oropharyngeal exudate or posterior oropharyngeal erythema.  Eyes:     General:        Right eye: No discharge.        Left eye: No discharge.      Conjunctiva/sclera: Conjunctivae normal.  Cardiovascular:     Rate and Rhythm: Normal rate and regular rhythm.     Pulses: Normal pulses.     Heart sounds: Normal heart sounds.  Pulmonary:     Effort: Pulmonary effort is normal. No respiratory distress.     Breath sounds: Normal breath sounds. No wheezing or rales.  Abdominal:     General: Bowel sounds are decreased. There is no distension.     Tenderness: There is no abdominal tenderness. There is no right CVA tenderness, left CVA tenderness, guarding or rebound.     Comments: R sided bowel sounds diminished in comparison to the LUQ and LLQ.  Musculoskeletal:        General: No deformity.     Cervical back: Neck supple.  Skin:    General: Skin is warm and dry.  Neurological:     Mental Status: He is alert. Mental status is at baseline.  Psychiatric:        Mood and Affect: Mood normal.     (all labs ordered are listed, but only abnormal results are displayed) Labs Reviewed  COMPREHENSIVE METABOLIC PANEL WITH GFR - Abnormal; Notable for the following components:      Result Value   Sodium 134 (*)    Glucose, Bld 116 (*)    Creatinine, Ser 1.32 (*)    All other components within normal limits  CBC - Abnormal; Notable for the following components:   WBC 13.2 (*)    RBC 6.84 (*)    Hemoglobin 19.1 (*)    HCT 58.3 (*)    RDW 15.9 (*)    All other components within normal limits  LIPASE, BLOOD  URINALYSIS, ROUTINE W REFLEX MICROSCOPIC    EKG: None  Radiology: No results found.  {Document cardiac monitor, telemetry assessment procedure when appropriate:32947} Procedures   Medications Ordered in the ED - No data to display    {Click here for ABCD2, HEART and other calculators REFRESH Note before signing:1}                              Medical Decision Making 63 year old male presents with concern for abdominal pain, bloating, and constipation.  History of SBO.  Mild hypertensive intake, vital signs otherwise  normal.  Cardiopulmonary exam is unremarkable, abdominal exam as above with decreased bowel sounds on the right side compared to the left, otherwise unremarkable.  Differential diagnosis of epigastric pain includes but is not limited to: Functional or nonulcer dyspepsia (MCC), PUD, GERD, Gastritis, (NSAIDs, alcohol, stress, H. pylori, pernicious anemia), pancreatitis / pancreatic cancer, overeating indigestion (high-fat foods, coffee), drugs (aspirin, antibiotics (eg, macrolides, metronidazole), corticosteroids,  digoxin, narcotics, theophylline), gastroparesis, gastric volvulus, gastric cancer, lactose intolerance, malabsorption, parasitic infection (Giardia, Strongyloides, Ascaris), abdominal hernia, intestinal ischemia, esophageal rupture,  cholelithiasis /choledocholithiasis / cholangitis, hepatitis, ACS, pericarditis, pneumonia, pregnancy.      Amount and/or Complexity of Data Reviewed Labs: ordered.    Details: CBC with elevated hemoglobin at 19, white count mildly elevated to 13.  CMP with mild AKI with creatinine of 1.3.  UA unremarkable. Radiology: ordered.   ***  {Document critical care time when appropriate  Document review of labs and clinical decision tools ie CHADS2VASC2, etc  Document your independent review of radiology images and any outside records  Document your discussion with family members, caretakers and with consultants  Document social determinants of health affecting pt's care  Document your decision making why or why not admission, treatments were needed:32947:::1}   Final diagnoses:  None    ED Discharge Orders     None

## 2024-03-05 ENCOUNTER — Encounter (HOSPITAL_COMMUNITY): Payer: Self-pay | Admitting: Internal Medicine

## 2024-03-05 ENCOUNTER — Emergency Department (HOSPITAL_COMMUNITY)

## 2024-03-05 ENCOUNTER — Other Ambulatory Visit: Payer: Self-pay

## 2024-03-05 DIAGNOSIS — K56609 Unspecified intestinal obstruction, unspecified as to partial versus complete obstruction: Secondary | ICD-10-CM

## 2024-03-05 DIAGNOSIS — R140 Abdominal distension (gaseous): Secondary | ICD-10-CM | POA: Diagnosis not present

## 2024-03-05 DIAGNOSIS — D72829 Elevated white blood cell count, unspecified: Secondary | ICD-10-CM

## 2024-03-05 DIAGNOSIS — R1084 Generalized abdominal pain: Secondary | ICD-10-CM | POA: Diagnosis not present

## 2024-03-05 DIAGNOSIS — R109 Unspecified abdominal pain: Secondary | ICD-10-CM | POA: Diagnosis present

## 2024-03-05 DIAGNOSIS — E782 Mixed hyperlipidemia: Secondary | ICD-10-CM | POA: Diagnosis not present

## 2024-03-05 DIAGNOSIS — R110 Nausea: Secondary | ICD-10-CM

## 2024-03-05 DIAGNOSIS — K5669 Other partial intestinal obstruction: Secondary | ICD-10-CM | POA: Diagnosis not present

## 2024-03-05 LAB — CBC WITH DIFFERENTIAL/PLATELET
Abs Immature Granulocytes: 0.03 K/uL (ref 0.00–0.07)
Basophils Absolute: 0.1 K/uL (ref 0.0–0.1)
Basophils Relative: 1 %
Eosinophils Absolute: 0.4 K/uL (ref 0.0–0.5)
Eosinophils Relative: 5 %
HCT: 53.9 % — ABNORMAL HIGH (ref 39.0–52.0)
Hemoglobin: 17.7 g/dL — ABNORMAL HIGH (ref 13.0–17.0)
Immature Granulocytes: 0 %
Lymphocytes Relative: 25 %
Lymphs Abs: 2.2 K/uL (ref 0.7–4.0)
MCH: 27.9 pg (ref 26.0–34.0)
MCHC: 32.8 g/dL (ref 30.0–36.0)
MCV: 84.9 fL (ref 80.0–100.0)
Monocytes Absolute: 0.9 K/uL (ref 0.1–1.0)
Monocytes Relative: 10 %
Neutro Abs: 5.2 K/uL (ref 1.7–7.7)
Neutrophils Relative %: 59 %
Platelets: 145 K/uL — ABNORMAL LOW (ref 150–400)
RBC: 6.35 MIL/uL — ABNORMAL HIGH (ref 4.22–5.81)
RDW: 15.5 % (ref 11.5–15.5)
WBC: 8.8 K/uL (ref 4.0–10.5)
nRBC: 0 % (ref 0.0–0.2)

## 2024-03-05 LAB — COMPREHENSIVE METABOLIC PANEL WITH GFR
ALT: 32 U/L (ref 0–44)
AST: 29 U/L (ref 15–41)
Albumin: 3.5 g/dL (ref 3.5–5.0)
Alkaline Phosphatase: 36 U/L — ABNORMAL LOW (ref 38–126)
Anion gap: 12 (ref 5–15)
BUN: 17 mg/dL (ref 8–23)
CO2: 22 mmol/L (ref 22–32)
Calcium: 9.2 mg/dL (ref 8.9–10.3)
Chloride: 102 mmol/L (ref 98–111)
Creatinine, Ser: 1.1 mg/dL (ref 0.61–1.24)
GFR, Estimated: >60 mL/min (ref >60)
Glucose, Bld: 96 mg/dL (ref 70–99)
Potassium: 3.8 mmol/L (ref 3.5–5.1)
Sodium: 136 mmol/L (ref 135–145)
Total Bilirubin: 1.3 mg/dL — ABNORMAL HIGH (ref 0.0–1.2)
Total Protein: 6.4 g/dL — ABNORMAL LOW (ref 6.5–8.1)

## 2024-03-05 LAB — MAGNESIUM: Magnesium: 1.7 mg/dL (ref 1.7–2.4)

## 2024-03-05 LAB — PROTIME-INR
INR: 0.9 (ref 0.8–1.2)
Prothrombin Time: 12.9 s (ref 11.4–15.2)

## 2024-03-05 LAB — TROPONIN I (HIGH SENSITIVITY)
Troponin I (High Sensitivity): 14 ng/L (ref <18)
Troponin I (High Sensitivity): 15 ng/L (ref <18)

## 2024-03-05 MED ORDER — ONDANSETRON HCL 4 MG/2ML IJ SOLN
4.0000 mg | Freq: Once | INTRAMUSCULAR | Status: AC
  Administered 2024-03-05: 4 mg via INTRAVENOUS
  Filled 2024-03-05: qty 2

## 2024-03-05 MED ORDER — ACETAMINOPHEN 650 MG RE SUPP: 650.0000 mg | Freq: Four times a day (QID) | RECTAL | Status: DC | PRN

## 2024-03-05 MED ORDER — IOHEXOL 350 MG/ML SOLN
85.0000 mL | Freq: Once | INTRAVENOUS | Status: AC | PRN
  Administered 2024-03-05: 85 mL via INTRAVENOUS

## 2024-03-05 MED ORDER — ACETAMINOPHEN 325 MG PO TABS: 650.0000 mg | ORAL_TABLET | Freq: Four times a day (QID) | ORAL | Status: DC | PRN

## 2024-03-05 MED ORDER — ONDANSETRON HCL 4 MG/2ML IJ SOLN: 4.0000 mg | Freq: Four times a day (QID) | INTRAMUSCULAR | Status: DC | PRN

## 2024-03-05 MED ORDER — SODIUM CHLORIDE 0.9 % IV SOLN: 12.5000 mg | Freq: Four times a day (QID) | INTRAVENOUS | Status: DC | PRN

## 2024-03-05 MED ORDER — LACTATED RINGERS IV SOLN: INTRAVENOUS | Status: AC

## 2024-03-05 MED ORDER — FENTANYL CITRATE PF 50 MCG/ML IJ SOSY: 50.0000 ug | PREFILLED_SYRINGE | INTRAMUSCULAR | Status: DC | PRN

## 2024-03-05 MED ORDER — NALOXONE HCL 0.4 MG/ML IJ SOLN: 0.4000 mg | INTRAMUSCULAR | Status: DC | PRN

## 2024-03-05 MED ORDER — FENTANYL CITRATE PF 50 MCG/ML IJ SOSY: 25.0000 ug | PREFILLED_SYRINGE | INTRAMUSCULAR | Status: DC | PRN

## 2024-03-05 NOTE — Consult Note (Signed)
 [name] 12/29/1960  [id number].    Requesting MD: Dr. Eva Pore  Chief Complaint/Reason for Consult: SBO  HPI: Paul Rich is a 63 y.o. male who presented to the ED with abdominal pain. Patient reports yesterday he developed upper abdominal pain, bloating, abdominal tightness with associated nausea. No vomiting. Was still able to drink soup last night and had 2 BM's last night. Pain began radiating down the sides of his abdomen, nausea persisted in waves and his abdomen became more tight, similar to how his last SBO felt, prompting his visit to the ED. CT w/ SBO. Admitted to [facility]. General surgery asked to see. He reports since admission his abdominal pain, abdominal tightness, bloating and nausea has resolved. He is passing flatus and since I saw him had a normal BM. On baby asa daily. Hx of left inguinal hernia repair x2 and umbilical hernia repair with mesh 2012 by Dr. Curvin. Hx of SBO in 2021 that resolved with conservative management.   ROS: ROS As above, see hpi  Family History  Problem Relation Age of Onset   Diabetes Mother    Heart disease Mother    Hyperlipidemia Mother    Hypertension Mother    Heart disease Maternal Grandmother    Hyperlipidemia Maternal Grandmother    Heart disease Maternal Grandfather    Kidney disease Paternal Grandmother    Lung cancer Paternal Grandmother    Lung cancer Paternal Grandfather    Heart disease Paternal Grandfather    Colon cancer Neg Hx    Colon polyps Neg Hx    Esophageal cancer Neg Hx    Rectal cancer Neg Hx    Stomach cancer Neg Hx     Past Medical History:  Diagnosis Date   Allergy    pollen   AV block    Childhood asthma    Environmental allergies    Hemorrhage of rectum and anus    Other and unspecified hyperlipidemia    SBO (small bowel obstruction) (HCC) 08/2019   Sleep apnea    stopped using cpap years ago   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 He has never used smokeless tobacco. He reports current alcohol use of about 3.0 - 4.0 standard drinks of alcohol per week. He reports that he does not use drugs.  Allergies:  Allergies  Allergen Reactions   Shellfish Allergy Anaphylaxis and Swelling    Of swelling of eyes Of swelling of eyes Of swelling of eyes   Other Hives   Mold Extract [Trichophyton]     Medications Prior to Admission  Medication Sig Dispense Refill   aspirin 81 MG tablet Take 81 mg by mouth daily.     benzonatate  (TESSALON ) 100 MG capsule Take 1 capsule (100 mg total) by mouth 3 (three) times daily as needed for cough. 20 capsule 0   guaiFENesin -codeine  100-10 MG/5ML syrup Take 5-10 mLs by mouth at bedtime as needed for cough. 120 mL 0   miconazole  (MICOTIN) 2 % cream Apply 1 Application topically 2 (two) times daily. 28.35 g 0   Multiple Vitamin (MULTIVITAMIN) tablet Take 1 tablet by  mouth daily.     niacinamide 500 MG tablet Take 500 mg by mouth 2 (two) times daily with a meal.     Omega 3 1000 MG CAPS Take by mouth.     omega-3 acid ethyl esters (LOVAZA ) 1 g capsule TAKE 2 CAPSULES TWICE A DAY 360 capsule 3   OVER THE COUNTER MEDICATION Take 1 capsule by mouth daily. Nitric Oxide     OVER THE COUNTER MEDICATION Take 1 capsule by mouth daily. Vitamin A & D Combination     pravastatin  (PRAVACHOL ) 20 MG tablet TAKE 1 TABLET DAILY 90 tablet 3   Pregnenolone Micronized (PREGNENOLONE PO) Take 1 capsule by mouth daily.     PRESCRIPTION MEDICATION HCG injectable taking 2 ml  Two times weekly     RESVERATROL PO Take 1 capsule by mouth daily.     tadalafil  (CIALIS ) 5 MG tablet Take 1 tablet (5 mg total) by mouth daily as needed for erectile dysfunction. 90 tablet 1    testosterone  cypionate (DEPOTESTOSTERONE CYPIONATE) 200 MG/ML injection Inject 90 mg into the muscle every 7 (seven) days. 0.45 mL Twice a week     valACYclovir  (VALTREX ) 500 MG tablet Take 1 tablet (500 mg total) by mouth 2 (two) times daily. For 3 days, start at first sign of rash/flare. 6 tablet 2     Physical Exam: Blood pressure (!) 146/80, pulse (!) 59, temperature (!) 97.4 F (36.3 C), temperature source Oral, resp. rate 14, SpO2 95%. General: pleasant, WD/WN male who is laying in bed in NAD HEENT: head is normocephalic, atraumatic.  Sclera are non-icteric.  Heart: regular, rate, and rhythm.   Lungs:   Respiratory effort nonlabored Abd:  Soft, ND, NT, +BS. No masses, hernias, or organomegaly MS: no BUE or BLE edema Skin: warm and dry  Psych: A&Ox4 with an appropriate affect Neuro: normal speech, thought process intact, moves all extremities, gait not assessed  Results for orders placed or performed during the hospital encounter of 03/04/24 (from the past 48 hours)  Lipase, blood     Status: None   Collection Time: 03/04/24  9:17 PM  Result Value Ref Range   Lipase 43 11 - 51 U/L    Comment: Performed at [facility], [address], [place], [place] [address]  Comprehensive metabolic panel     Status: Abnormal   Collection Time: 03/04/24  9:17 PM  Result Value Ref Range   Sodium 134 (L) 135 - 145 mmol/L   Potassium 4.2 3.5 - 5.1 mmol/L   Chloride 100 98 - 111 mmol/L   CO2 23 22 - 32 mmol/L   Glucose, Bld 116 (H) 70 - 99 mg/dL    Comment: Glucose reference range applies only to samples taken after fasting for at least 8 hours.   BUN 21 8 - 23 mg/dL   Creatinine, Ser 8.67 (H) 0.61 - 1.24 mg/dL   Calcium 9.8 8.9 - 89.6 mg/dL   Total Protein 7.3 6.5 - 8.1 g/dL   Albumin 4.0 3.5 - 5.0 g/dL   AST 33 15 - 41 U/L   ALT 37 0 - 44 U/L   Alkaline Phosphatase 41 38 - 126 U/L   Total Bilirubin 1.2 0.0 - 1.2 mg/dL   GFR, Estimated >39 >39 mL/min    Comment:  (NOTE) Calculated using the CKD-EPI Creatinine Equation (2021)    Anion gap 11 5 - 15    Comment: Performed at [facility], [address] [place], [place]  [address]  CBC     Status: Abnormal   Collection Time: 03/04/24  9:17 PM  Result Value Ref Range   WBC 13.2 (H) 4.0 - 10.5 K/uL   RBC 6.84 (H) 4.22 - 5.81 MIL/uL   Hemoglobin 19.1 (H) 13.0 - 17.0 g/dL   HCT 41.6 (H) 60.9 - 47.9 %   MCV 85.2 80.0 - 100.0 fL   MCH 27.9 26.0 - 34.0 pg   MCHC 32.8 30.0 - 36.0 g/dL   RDW 84.0 (H) 88.4 - 84.4 %   Platelets 164 150 - 400 K/uL   nRBC 0.0 0.0 - 0.2 %    Comment: Performed at [facility], [address], [place], [place] [address]  Urinalysis, Routine w reflex microscopic -Urine, Clean Catch     Status: None   Collection Time: 03/04/24  9:17 PM  Result Value Ref Range   Color, Urine YELLOW YELLOW   APPearance CLEAR CLEAR   Specific Gravity, Urine 1.017 1.005 - 1.030   pH 6.0 5.0 - 8.0   Glucose, UA NEGATIVE NEGATIVE mg/dL   Hgb urine dipstick NEGATIVE NEGATIVE   Bilirubin Urine NEGATIVE NEGATIVE   Ketones, ur NEGATIVE NEGATIVE mg/dL   Protein, ur NEGATIVE NEGATIVE mg/dL   Nitrite NEGATIVE NEGATIVE   Leukocytes,Ua NEGATIVE NEGATIVE   RBC / HPF 0-5 0 - 5 RBC/hpf   WBC, UA 0-5 0 - 5 WBC/hpf   Bacteria, UA NONE SEEN NONE SEEN   Squamous Epithelial / HPF 0-5 0 - 5 /HPF    Comment: Performed at [facility], [address] [place], [place] [address]  Troponin I (High Sensitivity)     Status: None   Collection Time: 03/04/24  9:17 PM  Result Value Ref Range   Troponin I (High Sensitivity) 14 <18 ng/L    Comment: (NOTE) Elevated high sensitivity troponin I (hsTnI) values and significant  changes across serial measurements may suggest ACS but many other  chronic and acute conditions are known to elevate hsTnI results.  Refer to the Links section for chest pain algorithms and additional  guidance. Performed at [facility], [address]  [place], [place] [address]   Troponin I (High Sensitivity)     Status: None   Collection Time: 03/05/24  1:18 AM  Result Value Ref Range   Troponin I (High Sensitivity) 15 <18 ng/L    Comment: (NOTE) Elevated high sensitivity troponin I (hsTnI) values and significant  changes across serial measurements may suggest ACS but many other  chronic and acute conditions are known to elevate hsTnI results.  Refer to the Links section for chest pain algorithms and additional  guidance. Performed at [facility], [address] [place], [place] [address]   CBC with Differential/Platelet     Status: Abnormal   Collection Time: 03/05/24  6:08 AM  Result Value Ref Range   WBC 8.8 4.0 - 10.5 K/uL   RBC 6.35 (H) 4.22 - 5.81 MIL/uL   Hemoglobin 17.7 (H) 13.0 - 17.0 g/dL   HCT 46.0 (H) 60.9 - 47.9 %   MCV 84.9 80.0 - 100.0 fL   MCH 27.9 26.0 - 34.0 pg   MCHC 32.8 30.0 - 36.0 g/dL   RDW 84.4 88.4 - 84.4 %   Platelets 145 (L) 150 - 400 K/uL   nRBC 0.0 0.0 - 0.2 %   Neutrophils Relative % 59 %   Neutro Abs 5.2 1.7 - 7.7 K/uL   Lymphocytes Relative  25 %   Lymphs Abs 2.2 0.7 - 4.0 K/uL   Monocytes Relative 10 %   Monocytes Absolute 0.9 0.1 - 1.0 K/uL   Eosinophils Relative 5 %   Eosinophils Absolute 0.4 0.0 - 0.5 K/uL   Basophils Relative 1 %   Basophils Absolute 0.1 0.0 - 0.1 K/uL   Immature Granulocytes 0 %   Abs Immature Granulocytes 0.03 0.00 - 0.07 K/uL    Comment: Performed at [facility], [address], [place], [place] [address]  Comprehensive metabolic panel with GFR     Status: Abnormal   Collection Time: 03/05/24  6:08 AM  Result Value Ref Range   Sodium 136 135 - 145 mmol/L   Potassium 3.8 3.5 - 5.1 mmol/L   Chloride 102 98 - 111 mmol/L   CO2 22 22 - 32 mmol/L   Glucose, Bld 96 70 - 99 mg/dL    Comment: Glucose reference range applies only to samples taken after fasting for at least 8 hours.   BUN 17 8 - 23 mg/dL   Creatinine, Ser 8.89 0.61 - 1.24 mg/dL   Calcium  9.2 8.9 - 89.6 mg/dL   Total Protein 6.4 (L) 6.5 - 8.1 g/dL   Albumin 3.5 3.5 - 5.0 g/dL   AST 29 15 - 41 U/L   ALT 32 0 - 44 U/L   Alkaline Phosphatase 36 (L) 38 - 126 U/L   Total Bilirubin 1.3 (H) 0.0 - 1.2 mg/dL   GFR, Estimated >39 >39 mL/min    Comment: (NOTE) Calculated using the CKD-EPI Creatinine Equation (2021)    Anion gap 12 5 - 15    Comment: Performed at [facility], [address], [place], [place] [address]  Magnesium     Status: None   Collection Time: 03/05/24  6:08 AM  Result Value Ref Range   Magnesium 1.7 1.7 - 2.4 mg/dL    Comment: Performed at [facility], [address] [place], [place] [address]  Protime-INR     Status: None   Collection Time: 03/05/24  6:08 AM  Result Value Ref Range   Prothrombin Time 12.9 11.4 - 15.2 seconds   INR 0.9 0.8 - 1.2    Comment: (NOTE) INR goal varies based on device and disease states. Performed at [facility], [address] [place], [place] [address]    CT ABDOMEN PELVIS W CONTRAST Result Date: 03/05/2024 CLINICAL DATA:  Abdominal discomfort EXAM: CT ABDOMEN AND PELVIS WITH CONTRAST TECHNIQUE: Multidetector CT imaging of the abdomen and pelvis was performed using the standard protocol following bolus administration of intravenous contrast. RADIATION DOSE REDUCTION: This exam was performed according to the departmental dose-optimization program which includes automated exposure control, adjustment of the mA and/or kV according to patient size and/or use of iterative reconstruction technique. CONTRAST:  85mL OMNIPAQUE  IOHEXOL  350 MG/ML SOLN COMPARISON:  CT 08/18/2020 FINDINGS: Lower chest: Lung bases demonstrate no acute airspace disease. Hepatobiliary: No focal liver abnormality is seen. No gallstones, gallbladder wall thickening, or biliary dilatation. Pancreas: Unremarkable. No pancreatic ductal dilatation or surrounding inflammatory changes. Spleen: Normal in size without focal abnormality.  Adrenals/Urinary Tract: Adrenal glands are normal. Limited assessment of renal collecting systems due to excreted contrast. No hydronephrosis. The bladder is unremarkable aside from contrast. Stomach/Bowel: Moderate fluid distension of the stomach. Multiple fluid-filled mildly dilated loops of small bowel measuring up to 4.2 cm. Relative transition to decompressed small bowel in the right lower quadrant. Terminal ileum  is decompressed. Negative appendix. Diverticular disease of the colon Vascular/Lymphatic: Aortic atherosclerosis. No enlarged abdominal or pelvic lymph nodes. Reproductive: Prostate is unremarkable. Other: Negative for pelvic effusion or free air. Small fat containing inguinal hernias Musculoskeletal: No acute or suspicious osseous abnormality. 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 IMPRESSION: 1. Multiple fluid-filled mildly dilated loops of small bowel with relative transition to decompressed small bowel in the right lower quadrant. Findings are suspicious for partial or developing small bowel obstruction. 2. Diverticular disease of the colon without acute inflammatory process. 3. Aortic atherosclerosis. Aortic Atherosclerosis (ICD10-I70.0). Electronically Signed   By: Luke Bun M.D.   On: 03/05/2024 00:53    Anti-infectives (From admission, onward)    None       Assessment/Plan SBO - CT w/ multiple fluid-filled mildly dilated loops of small bowel with relative transition to decompressed small bowel in the right lower quadrant. Hx of left inguinal hernia repair x2 and umbilical hernia repair with mesh 2012 by Dr. Curvin. Hx of SBO in 2021 that resolved with conservative management.  - HDS without fever, tachycardia or hypotension. No peritonitis on exam. WBC 8.8. No current indication for emergency surgery - Keep K >=4, Phos >= 3, Mg >= 2 and mobilize for bowel function - Symptoms resolved, benign exam and patient with return of bowel function. Okay for  FLD, ADAT. If tolerates diet advancement, okay for discharge from our standpoint. If patient develops recurrent symptoms please let our team know as he may need NGT and SBO protocol.  - Agree with medical admission. We will follow with you.   FEN - FLD VTE - SCDs, okay for chem ppx from a general surgery standpoint ID - None  I reviewed nursing notes, last 24 h vitals and pain scores, last 48 h intake and output, last 24 h labs and trends, and last 24 h imaging results.    Ozell CHRISTELLA Shaper, [facility] Surgery 03/05/2024, 8:53 AM Please see Amion for pager number during day hours 7:00am-4:30pm

## 2024-03-05 NOTE — H&P (Signed)
 History and Physical      Paul Rich FMW:[id number] DOB: [birth year] DOA: 03/04/2024; DOS: 03/05/2024  PCP: Paul Reyes SAUNDERS, MD  Patient coming from: home   I have personally briefly reviewed patient's old medical records in [facility]  Chief Complaint: Abdominal pain  HPI: Paul Rich is a 63 y.o. male with medical history significant for small bowel obstruction in 2021, hyperlipidemia, who is admitted to [facility] on 03/04/2024 with partial versus developing small bowel obstruction after presenting from home to [facility] ED complaining of abdominal pain.   The patient reports 1 day of new onset sharp generalized abdominal discomfort, most prominent over the right lower abdominal quadrant, worse with palpation.  This has been associated with intermittent nausea in the absence of any vomiting, and also notes diminished flatus production over that timeframe.  Not associate any subjective fever, chills, rigors, or generalized myalgias.  No recent trauma.  No recent dysuria or gross hematuria nor any recent cough. no recent chest pain, shortness of breath, palpitations.   He has a history of small bowel striction in March 2021, prompting hospitalization at that time.  This was reported to resolve spontaneously with conservative measures.  He also has a history of inguinal hernia as well as umbilical hernia, status post associated repairs in January 2012.  As an outpatient, he is on a daily baby aspirin, but otherwise on no additional blood thinners.    ED Course:  Vital signs in the ED were notable for the following: Afebrile; heart rates in the 60s to 70s; systolic blood pressures in the 130s to 140s; respiratory rate 18, oxygen saturation 95% on room air.  Labs were notable for the following: CMP was notable for the following: Bicarbonate 23, creatinine 1.32 compared to 1.05 on 10/31/2022, glucose 116, liver enzymes are within normal limits.  Lipase 43.  High sensitive  troponin I 14.  CBC notable for white blood cell count 13,200.  Urinalysis showed no blood cells also leukocyte esterase/nitrate negative.  Per my interpretation, EKG in ED demonstrated the following: Sinus rhythm with first-degree AV block, bifascicular block, rate 61, no evidence of T wave or ST changes, including no evidence of ST elevation.  Imaging in the ED, per corresponding formal radiology read, was notable for the following: CT abdomen/pelvis with contrast showed multiple fluid-filled mildly dilated loops of small bowel with relative transition to decompressed small bowel in the right lower quadrant, with findings suggestive of partial or developing small bowel obstruction, without any evidence of bowel perforation or abscess.  This imaging also showed diverticular disease of the colon, without evidence of diverticulitis.  While in the ED, the following were administered: Zofran  4 mg IV x 1 dose.  Subsequently, the patient was admitted for further evaluation management of presenting small bowel obstruction, with presenting labs also notable for mild leukocytosis.     Review of Systems: As per HPI otherwise 10 point review of systems negative.   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 He has never used smokeless tobacco. He reports current alcohol use of about 3.0 - 4.0 standard drinks of alcohol per week. He reports  that he does not use drugs.   Allergies  Allergen Reactions   Shellfish Allergy Anaphylaxis and Swelling    Of swelling of eyes Of swelling of eyes Of swelling of eyes   Other Hives   Mold Extract [Trichophyton]     Family History  Problem Relation Age of Onset   Diabetes Mother    Heart disease Mother    Hyperlipidemia Mother    Hypertension Mother    Heart disease Maternal Grandmother    Hyperlipidemia Maternal Grandmother    Heart disease Maternal Grandfather    Kidney disease Paternal Grandmother    Lung cancer Paternal Grandmother    Lung cancer Paternal Grandfather    Heart disease Paternal Grandfather    Colon cancer Neg Hx    Colon polyps Neg Hx    Esophageal cancer Neg Hx    Rectal cancer Neg Hx    Stomach cancer Neg Hx     Family history reviewed and not pertinent    Prior to Admission medications   Medication Sig Start Date End Date Taking? Authorizing Provider  aspirin 81 MG tablet Take 81 mg by mouth daily.    [provider]  benzonatate  (TESSALON ) 100 MG capsule Take 1 capsule (100 mg total) by mouth 3 (three) times daily as needed for cough. 02/05/24   Paul Reyes SAUNDERS, MD  guaiFENesin -codeine  100-10 MG/5ML syrup Take 5-10 mLs by mouth at bedtime as needed for cough. 02/05/24   Paul Reyes SAUNDERS, MD  miconazole  (MICOTIN) 2 % cream Apply 1 Application topically 2 (two) times daily. 03/08/22   Paul Reyes SAUNDERS, MD  Multiple Vitamin (MULTIVITAMIN) tablet Take 1 tablet by mouth daily.    [provider]  niacinamide 500 MG tablet Take 500 mg by mouth 2 (two) times daily with a meal.    [provider]  Omega 3 1000 MG CAPS Take by mouth.    [provider]  omega-3 acid ethyl esters (LOVAZA ) 1 g capsule TAKE 2 CAPSULES TWICE A DAY 11/20/22   Paul Reyes SAUNDERS, MD  OVER THE COUNTER MEDICATION Take 1 capsule by mouth daily. Nitric Oxide    [provider]  OVER THE COUNTER MEDICATION Take 1 capsule by mouth daily.  Vitamin A & D Combination    [provider]  pravastatin  (PRAVACHOL ) 20 MG tablet TAKE 1 TABLET DAILY 04/23/23   Paul Reyes SAUNDERS, MD  Pregnenolone Micronized (PREGNENOLONE PO) Take 1 capsule by mouth daily.    [provider]  PRESCRIPTION MEDICATION HCG injectable taking 2 ml  Two times weekly    [provider]  RESVERATROL PO Take 1 capsule by mouth daily.    [provider]  tadalafil  (CIALIS ) 5 MG tablet Take 1 tablet (5 mg total) by mouth daily as needed for erectile dysfunction. 07/02/23   Paul Reyes SAUNDERS, MD  testosterone  cypionate (DEPOTESTOSTERONE CYPIONATE) 200 MG/ML injection Inject 90 mg into the muscle every 7 (seven) days. 0.45 mL Twice a week    [provider]  valACYclovir  (VALTREX ) 500 MG tablet Take 1 tablet (500 mg total) by mouth  2 (two) times daily. For 3 days, start at first sign of rash/flare. 11/08/22   Paul Reyes SAUNDERS, MD     Objective    Physical Exam: Vitals:   03/04/24 2040 03/05/24 0011  BP: (!) 148/97 (!) 132/92  Pulse: 75 61  Resp: 18 18  Temp: 98.9 F (37.2 C) 97.8 F (36.6 C)  TempSrc:  Oral  SpO2: 95% 95%    General: appears to be stated age; alert, oriented Skin: warm, dry, no rash Head:  AT/[place] Mouth:  Oral mucosa membranes appear dry, normal dentition Neck: supple; trachea midline Heart:  RRR; did not appreciate any M/R/G Lungs: CTAB, did not appreciate any wheezes, rales, or rhonchi Abdomen: Hypoactive bowel sounds; soft, tenderness to palpation over the  RLQ, without associated guarding, rigidity, or rebound tenderness Vascular: 2+ pedal pulses b/l; 2+ radial pulses b/l Extremities: no peripheral edema, no muscle wasting   Labs on Admission: I have personally reviewed following labs and imaging studies  CBC: Recent Labs  Lab 03/04/24 2117  WBC 13.2*  HGB 19.1*  HCT 58.3*  MCV 85.2  PLT 164   Basic Metabolic Panel: Recent Labs  Lab 03/04/24 2117  NA 134*  K 4.2  CL 100   CO2 23  GLUCOSE 116*  BUN 21  CREATININE 1.32*  CALCIUM 9.8   GFR: CrCl cannot be calculated (Unknown ideal weight.). Liver Function Tests: Recent Labs  Lab 03/04/24 2117  AST 33  ALT 37  ALKPHOS 41  BILITOT 1.2  PROT 7.3  ALBUMIN 4.0   Recent Labs  Lab 03/04/24 2117  LIPASE 43   No results for input(s): AMMONIA in the last 168 hours. Coagulation Profile: No results for input(s): INR, PROTIME in the last 168 hours. Cardiac Enzymes: No results for input(s): CKTOTAL, CKMB, CKMBINDEX, TROPONINI in the last 168 hours. BNP (last 3 results) No results for input(s): PROBNP in the last 8760 hours. HbA1C: No results for input(s): HGBA1C in the last 72 hours. CBG: No results for input(s): GLUCAP in the last 168 hours. Lipid Profile: No results for input(s): CHOL, HDL, LDLCALC, TRIG, CHOLHDL, LDLDIRECT in the last 72 hours. Thyroid  Function Tests: No results for input(s): TSH, T4TOTAL, FREET4, T3FREE, THYROIDAB in the last 72 hours. Anemia Panel: No results for input(s): VITAMINB12, FOLATE, FERRITIN, TIBC, IRON, RETICCTPCT in the last 72 hours. Urine analysis:    Component Value Date/Time   COLORURINE YELLOW 03/04/2024 2117   APPEARANCEUR CLEAR 03/04/2024 2117   LABSPEC 1.017 03/04/2024 2117   PHURINE 6.0 03/04/2024 2117   GLUCOSEU NEGATIVE 03/04/2024 2117   HGBUR NEGATIVE 03/04/2024 2117   BILIRUBINUR NEGATIVE 03/04/2024 2117   KETONESUR NEGATIVE 03/04/2024 2117   PROTEINUR NEGATIVE 03/04/2024 2117   NITRITE NEGATIVE 03/04/2024 2117   LEUKOCYTESUR NEGATIVE 03/04/2024 2117    Radiological Exams on Admission: CT ABDOMEN PELVIS W CONTRAST Result Date: 03/05/2024 CLINICAL DATA:  Abdominal discomfort EXAM: CT ABDOMEN AND PELVIS WITH CONTRAST TECHNIQUE: Multidetector CT imaging of the abdomen and pelvis was performed using the standard protocol following bolus administration of intravenous contrast. RADIATION DOSE  REDUCTION: This exam was performed according to the departmental dose-optimization program which includes automated exposure control, adjustment of the mA and/or kV according to patient size and/or use of iterative reconstruction technique. CONTRAST:  85mL OMNIPAQUE  IOHEXOL  350 MG/ML SOLN COMPARISON:  CT 08/18/2020 FINDINGS: Lower chest: Lung bases demonstrate no acute airspace disease. Hepatobiliary: No focal liver abnormality is seen. No gallstones, gallbladder wall thickening, or biliary dilatation. Pancreas: Unremarkable. No pancreatic  ductal dilatation or surrounding inflammatory changes. Spleen: Normal in size without focal abnormality. Adrenals/Urinary Tract: Adrenal glands are normal. Limited assessment of renal collecting systems due to excreted contrast. No hydronephrosis. The bladder is unremarkable aside from contrast. Stomach/Bowel: Moderate fluid distension of the stomach. Multiple fluid-filled mildly dilated loops of small bowel measuring up to 4.2 cm. Relative transition to decompressed small bowel in the right lower quadrant. Terminal ileum is decompressed. Negative appendix. Diverticular disease of the colon Vascular/Lymphatic: Aortic atherosclerosis. No enlarged abdominal or pelvic lymph nodes. Reproductive: Prostate is unremarkable. Other: Negative for pelvic effusion or free air. Small fat containing inguinal hernias Musculoskeletal: No acute or suspicious osseous abnormality. 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 IMPRESSION: 1. Multiple fluid-filled mildly dilated loops of small bowel with relative transition to decompressed small bowel in the right lower quadrant. Findings are suspicious for partial or developing small bowel obstruction. 2. Diverticular disease of the colon without acute inflammatory process. 3. Aortic atherosclerosis. Aortic Atherosclerosis (ICD10-I70.0). Electronically Signed   By: Luke Bun M.D.   On: 03/05/2024 00:53       Assessment/Plan   Principal Problem:   SBO (small bowel obstruction) (HCC) Active Problems:   HLD (hyperlipidemia)   Abdominal pain   Nausea   Leukocytosis      #) Small bowel obstruction: Diagnosis on the basis of 1 day new onset abdominal discomfort, most prominent in the right lower abdominal quadrant, associate with nausea and diminished flatus, with no inciting bowel movement following development of this abdominal discomfort, with CT abdomen/pelvis showing multiple fluid-filled mildly dilated loops of small bowel with a relative transition to decompressed small bowel in the right lower quadrant, findings suggestive of partial or developing small bowel obstruction, without any evidence of bowel perforation, these radiographic findings consistent with the distribution of the patient's most prominent abdominal discomfort.  This in the context of a history of prior small bowel striction in March 2021, which resolved with conservative measures, with risk factors that include postoperative adhesions, will also noting a history of inguinal and umbilical hernia repair, with today's imaging showing small fat-containing inguinal hernia.  In the absence of any associated vomiting since onset of patient's abdominal discomfort, patient would prefer to refrain from NGT placement. Will have low ensuing threshold for placement of NGT.  Will initiate conservative measures, as further outlined below, while monitoring for return of normal bowel function.  Plan: NPO.  Prn IV Zofran .  As needed IV Phenergan for nausea refractory to Phenergan.  As needed IV fentanyl .  Gentle IV fluids in the form of lactated Ringer 's at 75 cc/h x 8 hours.  Check magnesium level. Cmp in the AM. Check INR.  I have contacted the on-call general surgery group, requesting nonurgent consult on the morning of 03/05/2024.  Monitor strict I's and O's and daily weights.  Refraining from pharmacologic DVT prophylaxis for now.  Hold  home daily baby aspirin.                      #) Leukocytosis: Presenting CBC reflects mildly elevated white cell count of 13,200. Suspect an element of hemoconcentration in the setting of diminished oral intake over the course of the last day in the setting of new onset abdominal discomfort associated with intermittent nausea, with interval elevation in hemoglobin level also consistent with a picture of hemoconcentration stemming from dehydration.  There may also be a reactive contribution in the setting of presenting partial versus developing small  bowel obstruction. No evidence to suggest underlying infectious process at this time, including urinalysis that was inconsistent with UTI, with CT abdomen/pelvis shows no evidence of bowel perforation or any additional evidence of acute intra-abdominal or acute intrapelvic infection.  No acute respiratory symptoms to increase index of suspicion for underlying acute respiratory infection.  No additional SIRS criteria met at this time. Overall, criteria for sepsis are not currently met. Appears hemodynamically stable.  Therefore, will refrain from initiation of antibiotics at this time.  Plan: Repeat CBC with diff in the morning.  Monitor strict I's and O's, daily weights.  IV fluids, as above.  Further evaluation management of presenting small obstruction, as above.                       #) Hyperlipidemia: documented h/o such. On pravastatin  as outpatient.   Plan: In the setting of current n.p.o. status, will hold home statin.       DVT prophylaxis: SCD's   Code Status: Full code Family Communication: none Disposition Plan: Per Rounding Team Consults called: I've contacted general surgery via secure chat requesting non-urgent consult this morning. ;  Admission status: inpatient     I SPENT GREATER THAN 75  MINUTES IN CLINICAL CARE TIME/MEDICAL DECISION-MAKING IN COMPLETING THIS ADMISSION.      Hyland Mollenkopf B  Jolea Dolle DO [facility]  From 7PM - 7AM   03/05/2024, 2:26 AM

## 2024-03-05 NOTE — Plan of Care (Signed)

## 2024-03-05 NOTE — Progress Notes (Signed)
 Brief progress note: - Patient was admitted with partial small bowel obstruction/small bowel obstruction.  - As per H&P done on presentation: Paul Rich is a 63 y.o. male with medical history significant for small bowel obstruction in 2021, hyperlipidemia, who is admitted to [facility] on 03/04/2024 with partial versus developing small bowel obstruction after presenting from home to [facility] ED complaining of abdominal pain.    The patient reports 1 day of new onset sharp generalized abdominal discomfort, most prominent over the right lower abdominal quadrant, worse with palpation.  This has been associated with intermittent nausea in the absence of any vomiting, and also notes diminished flatus production over that timeframe.  Not associate any subjective fever, chills, rigors, or generalized myalgias.  No recent trauma.  No recent dysuria or gross hematuria nor any recent cough. no recent chest pain, shortness of breath, palpitations.    He has a history of small bowel striction in March 2021, prompting hospitalization at that time.  This was reported to resolve spontaneously with conservative measures.  He also has a history of inguinal hernia as well as umbilical hernia, status post associated repairs in January 2012.   As an outpatient, he is on a daily baby aspirin, but otherwise on no additional blood thinners.       ED Course:  Vital signs in the ED were notable for the following: Afebrile; heart rates in the 60s to 70s; systolic blood pressures in the 130s to 140s; respiratory rate 18, oxygen saturation 95% on room air.   Labs were notable for the following: CMP was notable for the following: Bicarbonate 23, creatinine 1.32 compared to 1.05 on 10/31/2022, glucose 116, liver enzymes are within normal limits.  Lipase 43.  High sensitive troponin I 14.  CBC notable for white blood cell count 13,200.  Urinalysis showed no blood cells also leukocyte esterase/nitrate negative.   Per my  interpretation, EKG in ED demonstrated the following: Sinus rhythm with first-degree AV block, bifascicular block, rate 61, no evidence of T wave or ST changes, including no evidence of ST elevation.   Imaging in the ED, per corresponding formal radiology read, was notable for the following: CT abdomen/pelvis with contrast showed multiple fluid-filled mildly dilated loops of small bowel with relative transition to decompressed small bowel in the right lower quadrant, with findings suggestive of partial or developing small bowel obstruction, without any evidence of bowel perforation or abscess.  This imaging also showed diverticular disease of the colon, without evidence of diverticulitis.   While in the ED, the following were administered: Zofran  4 mg IV x 1 dose.   Subsequently, the patient was admitted for further evaluation management of presenting small bowel obstruction, with presenting labs also notable for mild leukocytosis.    03/05/2024: Patient seen alongside patient's wife.  Input from the surgical team is highly appreciated.  Small bowel obstruction has resolved significantly.  Patient is currently on clear liquid diet.  Diet will be advanced as tolerated.  Likely discharge tomorrow.

## 2024-03-05 NOTE — ED Notes (Signed)
 MD made aware of pts HR dropping in low 40's/30's while sleeping. No new orders at this time

## 2024-03-05 NOTE — ED Notes (Signed)
 [facility] called and made aware of pts arrival

## 2024-03-05 NOTE — ED Notes (Signed)
 Family at bedside.

## 2024-03-06 DIAGNOSIS — K56609 Unspecified intestinal obstruction, unspecified as to partial versus complete obstruction: Secondary | ICD-10-CM | POA: Diagnosis not present

## 2024-03-06 MED ORDER — PRAVASTATIN SODIUM 20 MG PO TABS: 20.0000 mg | ORAL_TABLET | Freq: Every day | ORAL | Status: DC

## 2024-03-06 NOTE — Discharge Summary (Signed)
 Physician Discharge Summary  Patient ID: Datrell Dunton MRN: [id number] DOB/AGE: Jul 10, 1960 63 y.o.  Admit date: 03/04/2024 Discharge date: 03/06/2024  Admission Diagnoses:  Discharge Diagnoses:  Principal Problem:   SBO (small bowel obstruction) (HCC) Active Problems:   HLD (hyperlipidemia)   Abdominal pain   Nausea   Leukocytosis   Discharged Condition: stable  Hospital Course: Patient is a 63 year old male past medical history significant for small bowel obstruction in 2021, hyperlipidemia, sleep apnea, testosterone  deficiency, childhood asthma and environmental allergies.  Patient was admitted with small bowel obstruction.  Patient was admitted and managed supportively.  Surgical team assisted with patient's management.  Small bowel obstruction has resolved.  Patient is tolerating orally.  Patient will be discharged back onto the care of the primary care provider.  Small bowel obstruction: - Managed supportively.  Leukocytosis: - Likely reactive. - WBC of 13.2 on presentation. - WBC has improved to 8.8 prior to discharge.  Hyperlipidemia: - Continue pravastatin .  Consults: general surgery  Significant Diagnostic Studies:  CT abdomen and pelvis with contrast revealed: 1. Multiple fluid-filled mildly dilated loops of small bowel with relative transition to decompressed small bowel in the right lower quadrant. Findings are suspicious for partial or developing small bowel obstruction. 2. Diverticular disease of the colon without acute inflammatory process. 3. Aortic atherosclerosis.  Treatments: Patient was managed supportively.  Discharge Exam: Blood pressure (!) 147/92, pulse 63, temperature (!) 97.5 F (36.4 C), temperature source Oral, resp. rate 19, height 6' 2 (1.88 m), weight 102.1 kg, SpO2 96%.   Disposition: Discharge disposition: 01-Home or Self Care       Discharge Instructions     Diet - low sodium heart healthy   Complete by: As directed     Increase activity slowly   Complete by: As directed       Allergies as of 03/06/2024       Reactions   Shellfish Allergy Anaphylaxis, Swelling   Swelling around eyes   Mold Extract [trichophyton] Other (See Comments)   Sinus, respiratory symptoms        Medication List     STOP taking these medications    ADK PO   aspirin 81 MG tablet   Multivitamin Men 50+ Tabs   NIACINAMIDE PO   omega-3 acid ethyl esters 1 g capsule Commonly known as: LOVAZA    RESVERATROL PO       TAKE these medications    HCG IJ Inject 2 mLs into the skin See admin instructions. Inject 2mL into the skin twice a week, Saturday and Wednesday.   pravastatin  20 MG tablet Commonly known as: PRAVACHOL  Take 1 tablet (20 mg total) by mouth at bedtime.   tadalafil  5 MG tablet Commonly known as: Cialis  Take 1 tablet (5 mg total) by mouth daily as needed for erectile dysfunction.   TESTOSTERONE  IM Inject 0.4 mLs into the muscle every Saturday.       Time spent: 35 minutes.  SignedBETHA Leatrice LILLETTE Rosario 03/06/2024, 10:15 AM

## 2024-03-24 ENCOUNTER — Other Ambulatory Visit: Payer: Self-pay | Admitting: Family Medicine

## 2024-03-24 DIAGNOSIS — R894 Abnormal immunological findings in specimens from other organs, systems and tissues: Secondary | ICD-10-CM

## 2024-03-24 MED ORDER — VALACYCLOVIR HCL 500 MG PO TABS: 500.0000 mg | ORAL_TABLET | Freq: Two times a day (BID) | ORAL | 2 refills | Status: AC

## 2024-03-24 NOTE — Telephone Encounter (Signed)
 Copied from CRM [id number]. Topic: Clinical - Medication Refill >> Mar 24, 2024  9:28 AM Mesmerise C wrote: Medication: valACYclovir  (VALTREX ) 500 MG tablet  Has the patient contacted their pharmacy? No (Agent: If no, request that the patient contact the pharmacy for the refill. If patient does not wish to contact the pharmacy document the reason why and proceed with request.) (Agent: If yes, when and what did the pharmacy advise?) Discontinued on chart This is the patient's preferred pharmacy:  [id number] [place], [place] - [address] AT [facility] [address] & [facility] [address] [place] [address] Phone: [phone] Fax: [phone]  Is this the correct pharmacy for this prescription? Yes If no, delete pharmacy and type the correct one.   Has the prescription been filled recently? No  Is the patient out of the medication? No  Has the patient been seen for an appointment in the last year OR does the patient have an upcoming appointment? Yes  Can we respond through MyChart? Yes  Agent: Please be advised that Rx refills may take up to 3 business days. We ask that you follow-up with your pharmacy.

## 2024-03-24 NOTE — Telephone Encounter (Signed)
 Requested Prescriptions   Pending Prescriptions Disp Refills   valACYclovir  (VALTREX ) 500 MG tablet 6 tablet 2    Sig: Take 1 tablet (500 mg total) by mouth 2 (two) times daily. For 3 days, start at first sign of rash/flare.     Date of patient request: 03/24/2024 Last office visit: 02/05/2024 Upcoming visit: Visit date not found Date of last refill: 11/08/2022 Last refill amount: 6x2

## 2024-04-19 ENCOUNTER — Other Ambulatory Visit: Payer: Self-pay | Admitting: Family Medicine

## 2024-04-19 DIAGNOSIS — E785 Hyperlipidemia, unspecified: Secondary | ICD-10-CM

## 2024-04-19 NOTE — Telephone Encounter (Signed)
 Okay to refill under your name?
